# Patient Record
Sex: Female | Born: 1950 | Hispanic: No | Smoking: Never smoker
Health system: Southern US, Community
[De-identification: ages and names within clinical notes are randomized; demographics above are authoritative.]

## PROBLEM LIST (undated history)

## (undated) DIAGNOSIS — Z780 Asymptomatic menopausal state: Secondary | ICD-10-CM

## (undated) DIAGNOSIS — D219 Benign neoplasm of connective and other soft tissue, unspecified: Secondary | ICD-10-CM

## (undated) DIAGNOSIS — G4700 Insomnia, unspecified: Secondary | ICD-10-CM

## (undated) HISTORY — DX: Asymptomatic menopausal state: Z78.0

## (undated) HISTORY — PX: MYOMECTOMY: SHX85

## (undated) HISTORY — DX: Insomnia, unspecified: G47.00

## (undated) HISTORY — DX: Benign neoplasm of connective and other soft tissue, unspecified: D21.9

---

## 2007-05-07 ENCOUNTER — Encounter: Admission: RE | Admit: 2007-05-07 | Discharge: 2007-05-07 | Payer: Self-pay | Admitting: Family Medicine

## 2007-05-14 ENCOUNTER — Encounter: Admission: RE | Admit: 2007-05-14 | Discharge: 2007-05-14 | Payer: Self-pay | Admitting: Family Medicine

## 2007-07-03 ENCOUNTER — Ambulatory Visit: Payer: Self-pay | Admitting: Internal Medicine

## 2007-07-03 DIAGNOSIS — G4700 Insomnia, unspecified: Secondary | ICD-10-CM | POA: Insufficient documentation

## 2007-07-03 DIAGNOSIS — R109 Unspecified abdominal pain: Secondary | ICD-10-CM | POA: Insufficient documentation

## 2007-07-04 LAB — CONVERTED CEMR LAB
ALT: 22 units/L (ref 0–35)
AST: 24 units/L (ref 0–37)
Alkaline Phosphatase: 68 units/L (ref 39–117)
Basophils Absolute: 0.1 10*3/uL (ref 0.0–0.1)
Basophils Relative: 1.1 % — ABNORMAL HIGH (ref 0.0–1.0)
Bilirubin, Direct: 0.1 mg/dL (ref 0.0–0.3)
CO2: 31 meq/L (ref 19–32)
Cholesterol: 208 mg/dL (ref 0–200)
Creatinine, Ser: 0.8 mg/dL (ref 0.4–1.2)
Eosinophils Absolute: 0.1 10*3/uL (ref 0.0–0.7)
Eosinophils Relative: 1 % (ref 0.0–5.0)
GFR calc non Af Amer: 79 mL/min
HDL: 60.8 mg/dL (ref >39.0)
Hemoglobin: 14.1 g/dL (ref 12.0–15.0)
Leukocytes, UA: NEGATIVE
Lymphocytes Relative: 32.4 % (ref 12.0–46.0)
MCHC: 33.2 g/dL (ref 30.0–36.0)
Neutro Abs: 3.1 10*3/uL (ref 1.4–7.7)
Neutrophils Relative %: 55.9 % (ref 43.0–77.0)
Nitrite: NEGATIVE
RBC: 4.81 M/uL (ref 3.87–5.11)
Specific Gravity, Urine: <=1.005 (ref 1.000–1.03)
TSH: 1.58 microintl units/mL (ref 0.35–5.50)
Total CHOL/HDL Ratio: 3.4
Total Protein: 7.8 g/dL (ref 6.0–8.3)
Triglycerides: 44 mg/dL (ref 0–149)
Urine Glucose: NEGATIVE mg/dL
Urobilinogen, UA: 0.2 (ref 0.0–1.0)
WBC: 5.6 10*3/uL (ref 4.5–10.5)

## 2007-07-13 ENCOUNTER — Encounter: Admission: RE | Admit: 2007-07-13 | Discharge: 2007-07-13 | Payer: Self-pay | Admitting: Internal Medicine

## 2007-10-02 ENCOUNTER — Ambulatory Visit: Payer: Self-pay | Admitting: Internal Medicine

## 2007-10-02 DIAGNOSIS — R5383 Other fatigue: Secondary | ICD-10-CM

## 2007-10-02 DIAGNOSIS — R5381 Other malaise: Secondary | ICD-10-CM

## 2007-10-02 DIAGNOSIS — F411 Generalized anxiety disorder: Secondary | ICD-10-CM | POA: Insufficient documentation

## 2008-08-01 ENCOUNTER — Encounter: Payer: Self-pay | Admitting: Internal Medicine

## 2008-09-22 ENCOUNTER — Ambulatory Visit: Payer: Self-pay | Admitting: Internal Medicine

## 2008-09-24 ENCOUNTER — Ambulatory Visit: Payer: Self-pay | Admitting: Internal Medicine

## 2008-09-24 ENCOUNTER — Telehealth: Payer: Self-pay | Admitting: Internal Medicine

## 2008-09-26 ENCOUNTER — Encounter: Payer: Self-pay | Admitting: Internal Medicine

## 2009-01-07 ENCOUNTER — Encounter (INDEPENDENT_AMBULATORY_CARE_PROVIDER_SITE_OTHER): Payer: Self-pay | Admitting: *Deleted

## 2009-03-18 ENCOUNTER — Ambulatory Visit: Payer: Self-pay | Admitting: Internal Medicine

## 2009-03-18 DIAGNOSIS — J020 Streptococcal pharyngitis: Secondary | ICD-10-CM

## 2009-03-18 DIAGNOSIS — J039 Acute tonsillitis, unspecified: Secondary | ICD-10-CM

## 2009-03-18 LAB — CONVERTED CEMR LAB
Basophils Absolute: 0 10*3/uL (ref 0.0–0.1)
Basophils Relative: 0.4 % (ref 0.0–3.0)
Eosinophils Absolute: 0.1 10*3/uL (ref 0.0–0.7)
Eosinophils Relative: 1 % (ref 0.0–5.0)
HCT: 41 % (ref 36.0–46.0)
Lymphocytes Relative: 24.3 % (ref 12.0–46.0)
MCHC: 32.7 g/dL (ref 30.0–36.0)
Mono Screen: NEGATIVE
Monocytes Relative: 10.3 % (ref 3.0–12.0)
RBC: 4.51 M/uL (ref 3.87–5.11)
Rapid Strep: POSITIVE

## 2009-04-15 ENCOUNTER — Ambulatory Visit: Payer: Self-pay | Admitting: Internal Medicine

## 2009-04-15 DIAGNOSIS — R300 Dysuria: Secondary | ICD-10-CM | POA: Insufficient documentation

## 2009-04-16 LAB — CONVERTED CEMR LAB
ALT: 27 units/L (ref 0–35)
Albumin: 4.3 g/dL (ref 3.5–5.2)
BUN: 14 mg/dL (ref 6–23)
Bilirubin Urine: NEGATIVE
CO2: 33 meq/L — ABNORMAL HIGH (ref 19–32)
GFR calc non Af Amer: 54.17 mL/min (ref >60)
Glucose, Bld: 100 mg/dL — ABNORMAL HIGH (ref 70–99)
Ketones, ur: NEGATIVE mg/dL
Potassium: 4.1 meq/L (ref 3.5–5.1)
Sed Rate: 13 mm/hr (ref 0–22)
Specific Gravity, Urine: 1.01 (ref 1.000–1.030)
Urobilinogen, UA: 0.2 (ref 0.0–1.0)
pH: 5.5 (ref 5.0–8.0)

## 2010-02-02 ENCOUNTER — Ambulatory Visit: Payer: Self-pay | Admitting: Internal Medicine

## 2010-02-02 DIAGNOSIS — R1031 Right lower quadrant pain: Secondary | ICD-10-CM

## 2010-02-02 LAB — CONVERTED CEMR LAB
Basophils Relative: 0.5 % (ref 0.0–3.0)
Bilirubin Urine: NEGATIVE
CO2: 32 meq/L (ref 19–32)
Calcium: 9.1 mg/dL (ref 8.4–10.5)
Creatinine, Ser: 0.7 mg/dL (ref 0.4–1.2)
Eosinophils Relative: 0.9 % (ref 0.0–5.0)
HCT: 41.4 % (ref 36.0–46.0)
Hemoglobin, Urine: NEGATIVE
Hemoglobin: 14.1 g/dL (ref 12.0–15.0)
Lymphs Abs: 2.3 10*3/uL (ref 0.7–4.0)
Platelets: 227 10*3/uL (ref 150.0–400.0)
Potassium: 4.2 meq/L (ref 3.5–5.1)
RDW: 13.3 % (ref 11.5–14.6)
Total Bilirubin: 0.6 mg/dL (ref 0.3–1.2)
Total Protein, Urine: NEGATIVE mg/dL
Urine Glucose: NEGATIVE mg/dL
Urobilinogen, UA: 0.2 (ref 0.0–1.0)
pH: 6 (ref 5.0–8.0)

## 2010-02-05 ENCOUNTER — Encounter
Admission: RE | Admit: 2010-02-05 | Discharge: 2010-02-05 | Payer: Self-pay | Source: Home / Self Care | Attending: Internal Medicine | Admitting: Internal Medicine

## 2010-03-30 NOTE — Assessment & Plan Note (Signed)
Summary: fever x 5 days/plot/cd   Vital Signs:  Patient profile:   60 year old female Weight:      159 pounds O2 Sat:      97 % on Room air Temp:     98.3 degrees F oral Pulse rate:   82 / minute Pulse rhythm:   regular Resp:     16 per minute BP sitting:   104 / 68  (left arm) Cuff size:   large  Vitals Entered By: Rock Nephew CMA (March 18, 2009 9:59 AM)  O2 Flow:  Room air CC: congestion, fever, sorethroat, headache, URI symptoms Is Patient Diabetic? No   Primary Care Provider:  Georgina Quint Plotnikov MD  CC:  congestion, fever, sorethroat, headache, and URI symptoms.  History of Present Illness:  URI Symptoms      This is a 60 year old woman who presents with URI symptoms.  The symptoms began 3 weeks ago.  The severity is described as moderate.  The patient reports sore throat and sick contacts, but denies nasal congestion, clear nasal discharge, purulent nasal discharge, dry cough, productive cough, and earache.  Associated symptoms include low-grade fever (<100.5 degrees).  The patient denies stiff neck, dyspnea, wheezing, rash, vomiting, diarrhea, and use of an antipyretic.  The patient denies headache, muscle aches, and severe fatigue.  Risk factors for Strep sinusitis include double sickening, tender adenopathy, and absence of cough.    Current Medications (verified): 1)  Vitamin D3 1000 Unit  Tabs (Cholecalciferol) .Marland Kitchen.. 1 By Mouth Daily 2)  Alprazolam 0.25 Mg Tabs (Alprazolam) .... 1/2 - 1 By Mouth Once Daily - Two Times A Day As Needed Anxiety 3)  Rozerem 8 Mg Tabs (Ramelteon) .Marland Kitchen.. 1 By Mouth At Bedtime For Sleep 4)  Guiatuss Ac 100-10 Mg/21ml Syrp (Guaifenesin-Codeine) .... 5-10 Ml By Mouth Qid As Needed For Cough 5)  Amoxicillin-Pot Clavulanate 875-125 Mg Tabs (Amoxicillin-Pot Clavulanate) .... Take 1 Tablet By Mouth Two Times A Day  Allergies (verified): No Known Drug Allergies  Past History:  Past Medical History: Reviewed history from 07/03/2007 and no  changes required. Insomnia Menopause  Past Surgical History: Reviewed history from 07/03/2007 and no changes required. Myomectomy  Family History: Reviewed history from 07/03/2007 and no changes required. M well Fdied from war wounds  Social History: Reviewed history from 09/22/2008 and no changes required. Occupation: housewife, used to be an Art gallery manager, from  [place] Married Never Smoked Alcohol use-no Drug use-no Regular exercise-yes  Review of Systems       The patient complains of fever and enlarged lymph nodes.  The patient denies prolonged cough, abdominal pain, and suspicious skin lesions.    Physical Exam  General:  alert, well-developed, well-nourished, well-hydrated, normal appearance, healthy-appearing, cooperative to examination, and good hygiene.  mild occasional cough noted during exam. Head:  normocephalic and atraumatic.   Ears:  R ear normal and L ear normal.   Mouth:  good dentition, no pharyngeal crowing, no lesions, no aphthous ulcers, no erosions, no tongue abnormalities, no leukoplakia, no petechiae, pharyngeal erythema, tonsil hypertropied, pharyngeal exudate, posterior lymphoid hypertrophy, and postnasal drip.   Neck:  supple, full ROM, no masses, no thyromegaly, no thyroid nodules or tenderness, no JVD, no carotid bruits, and cervical lymphadenopathy.   Lungs:  normal respiratory effort, no intercostal retractions, no accessory muscle use, normal breath sounds, no dullness, no fremitus, no crackles, and no wheezes.   Heart:  normal rate, regular rhythm, no murmur, no gallop, no rub, and no  JVD.   Abdomen:  soft, non-tender, normal bowel sounds, no distention, no masses, no guarding, no rigidity, no hepatomegaly, and no splenomegaly.   Msk:  normal ROM, no joint tenderness, no joint swelling, no joint warmth, no redness over joints, no joint deformities, no joint instability, and no crepitation.   Pulses:  R and L carotid,radial,femoral,dorsalis pedis and  posterior tibial pulses are full and equal bilaterally Extremities:  No clubbing, cyanosis, edema, or deformity noted with normal full range of motion of all joints.   Neurologic:  No cranial nerve deficits noted. Station and gait are normal. Plantar reflexes are down-going bilaterally. DTRs are symmetrical throughout. Sensory, motor and coordinative functions appear intact. Skin:  turgor normal, color normal, no rashes, no suspicious lesions, and no ecchymoses.   Cervical Nodes:  R anterior LN tender and L anterior LN tender.   Axillary Nodes:  no R axillary adenopathy and no L axillary adenopathy.   Inguinal Nodes:  no R inguinal adenopathy and no L inguinal adenopathy.   Psych:  Cognition and judgment appear intact. Alert and cooperative with normal attention span and concentration. No apparent delusions, illusions, hallucinations   Impression & Recommendations:  Problem # 1:  TONSILLITIS, EXUDATIVE, ACUTE (ICD-463) Assessment New  this looks like mono so will give steroids and check labs  Orders: Venipuncture (62703) TLB-CBC Platelet - w/Differential (85025-CBCD) TLB-Mono Test (Monospot) (50093-GHWE) Admin of Therapeutic Inj  intramuscular or subcutaneous (99371) Depo- Medrol 40mg  (J1030) Depo- Medrol 80mg  (J1040) Rapid Strep (69678)  Problem # 2:  STREP THROAT (ICD-034.0) Assessment: New  if she has indeed been taking augmentin and is still RSS positive then this may be a refractory infection that requires Clindamycin The following medications were removed from the medication list:    Amoxicillin-pot Clavulanate 875-125 Mg Tabs (Amoxicillin-pot clavulanate) .Marland Kitchen... Take 1 tablet by mouth two times a day Her updated medication list for this problem includes:    Clindamycin Hcl 300 Mg Caps (Clindamycin hcl) ..... One by mouth three times a day for 10 days  Orders: Rapid Strep (93810)  Complete Medication List: 1)  Vitamin D3 1000 Unit Tabs (Cholecalciferol) .Marland Kitchen.. 1 by mouth  daily 2)  Alprazolam 0.25 Mg Tabs (Alprazolam) .... 1/2 - 1 by mouth once daily - two times a day as needed anxiety 3)  Rozerem 8 Mg Tabs (Ramelteon) .Marland Kitchen.. 1 by mouth at bedtime for sleep 4)  Clindamycin Hcl 300 Mg Caps (Clindamycin hcl) .... One by mouth three times a day for 10 days  Patient Instructions: 1)  Please schedule a follow-up appointment in 2 weeks. 2)  Take your antibiotic as prescribed until ALL of it is gone, but stop if you develop a rash or swelling and contact our office as soon as possible. Prescriptions: CLINDAMYCIN HCL 300 MG CAPS (CLINDAMYCIN HCL) One by mouth three times a day for 10 days  #30 x 0   Entered and Authorized by:   Etta Grandchild MD   Signed by:   Etta Grandchild MD on 03/18/2009   Method used:   Print then Give to Patient   RxID:   [id number]   Laboratory Results    Other Tests  Rapid Strep: positive    Medication Administration  Injection # 1:    Medication: Depo- Medrol 80mg     Diagnosis: TONSILLITIS, EXUDATIVE, ACUTE (ICD-463)    Route: IM    Site: R deltoid    Exp Date: 11/2009    Lot #: [id number]    Mfr:  [email or web address]    Patient tolerated injection without complications    Given by: Rock Nephew CMA (March 18, 2009 10:24 AM)  Injection # 2:    Medication: Depo- Medrol 40mg     Diagnosis: TONSILLITIS, EXUDATIVE, ACUTE (ICD-463)    Route: IM    Site: R deltoid    Exp Date: 11/2009    Lot #: [id number]    Mfr: teva    Patient tolerated injection without complications    Given by: Rock Nephew CMA (March 18, 2009 10:24 AM)  Orders Added: 1)  Venipuncture [95638] 2)  TLB-CBC Platelet - w/Differential [85025-CBCD] 3)  TLB-Mono Test (Monospot) [86308-MONO] 4)  Admin of Therapeutic Inj  intramuscular or subcutaneous [96372] 5)  Depo- Medrol 40mg  [J1030] 6)  Depo- Medrol 80mg  [J1040] 7)  Est. Patient Level IV [75643] 8)  Rapid Strep [32951]

## 2010-03-30 NOTE — Assessment & Plan Note (Signed)
Summary: ABD / BACK PAIN/CD   Vital Signs:  Patient profile:   60 year old female Height:      67 inches Weight:      160 pounds BMI:     25.15 Temp:     98.7 degrees F oral Pulse rate:   76 / minute Pulse rhythm:   regular Resp:     16 per minute BP sitting:   100 / 68  (left arm) Cuff size:   regular  Vitals Entered By: Lanier Prude, Beverly Gust) 02-14-10 4:34 PM) CC: Rt side abd & LBP Is Patient Diabetic? No   Primary Care Provider:  Georgina Quint Plotnikov MD  CC:  Rt side abd & LBP.  History of Present Illness: C/o pain in RLQ x 2 months off and on worse in am. No sweats or chills. C/o occasional LBP C/o frequent urination 2 wks ago - took [place] two times a day x 5 d - better. All symptoms are vague. It has started after 1 -2 months of intense  jogging  Current Medications (verified): 1)  Vitamin D3 1000 Unit  Tabs (Cholecalciferol) .Marland Kitchen.. 1 By Mouth Daily 2)  Alprazolam 0.25 Mg Tabs (Alprazolam) .... 1/2 - 1 By Mouth Once Daily - Two Times A Day As Needed Anxiety 3)  Rozerem 8 Mg Tabs (Ramelteon) .Marland Kitchen.. 1 By Mouth At Bedtime For Sleep 4)  Vesicare 5 Mg Tabs (Solifenacin Succinate) .Marland Kitchen.. 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  The patient denies fever, weight loss, chest pain, dyspnea on exertion, peripheral edema, melena, hematochezia, hematuria, difficulty walking, unusual weight change, and enlarged lymph nodes.    Physical  Exam  General:  alert, well-developed, well-nourished, well-hydrated, normal appearance, healthy-appearing, cooperative to examination, and good hygiene Eyes:  vision grossly intact, pupils equal, and pupils round.   Mouth:  WNL Neck:  supple, full ROM, no masses, no thyromegaly, no thyroid nodules or tenderness, no JVD, no carotid bruits, and cervical lymphadenopathy.   Lungs:  normal respiratory effort, no intercostal retractions, no accessory muscle use, normal breath sounds, no dullness, no fremitus, no crackles, and no wheezes.   Heart:  normal rate, regular rhythm, no murmur, no gallop, no rub, and no JVD.   Abdomen:  soft, non-tender, normal bowel sounds, no distention, no masses, no guarding, no rigidity, no hepatomegaly, and no splenomegaly.  The abd is sensitive in RLQ No hernia Msk:  LS - WNL R groin and thigh NT w/palp and w/ROM Extremities:  No clubbing, cyanosis, edema, or deformity noted with normal full range of motion of all joints.   Neurologic:  No cranial nerve deficits noted. Station and gait are normal. Plantar reflexes are down-going bilaterally. DTRs are symmetrical throughout. Sensory, motor and coordinative functions appear intact. Skin:  Intact without suspicious lesions or rashes Psych:  Cognition and judgment appear intact. Alert and cooperative with normal attention span and concentration. No apparent delusions, illusions, hallucinations   Impression &  Recommendations:  Problem # 1:  RLQ PAIN (ICD-789.03) - likely a muscle strain; need to r/o pelvic pathology Assessment New See "Patient Instructions". See meds. Orders: Radiology Referral (Radiology) - transvag. [place] TLB-BMP (Basic Metabolic Panel-BMET) (80048-METABOL) TLB-CBC Platelet - w/Differential (85025-CBCD) TLB-Hepatic/Liver Function Pnl (80076-HEPATIC) TLB-Sedimentation Rate (ESR) (85652-ESR) TLB-Udip ONLY (81003-UDIP)  Problem # 2:  LOW BACK PAIN, ACUTE (ICD-724.2) MSK - related to #1 Assessment:  Comment Only  Her updated medication list for this problem includes:    Ibuprofen 600 Mg Tabs (Ibuprofen) .Marland Kitchen... 1 by mouth bid  pc x 3 wks then as needed for  pain  Complete Medication List: 1)  Vitamin D3 1000 Unit Tabs (Cholecalciferol) .Marland Kitchen.. 1 by mouth daily 2)  Alprazolam 0.25 Mg Tabs (Alprazolam) .... 1/2 - 1 by mouth once daily - two times a day as needed anxiety 3)  Rozerem 8 Mg Tabs (Ramelteon) .Marland Kitchen.. 1 by mouth at bedtime for sleep 4)  Vesicare 5 Mg Tabs (Solifenacin succinate) .Marland Kitchen.. 1 by mouth once daily for your bladder 5)  Ibuprofen 600 Mg Tabs (Ibuprofen) .Marland Kitchen.. 1 by mouth bid  pc x 3 wks then as needed for  pain  Patient Instructions: 1)  Use stretching and balance exercises that I have provided (15 min. or longer every day)  2)  Call if you are not better in a reasonable amount of time or if worse.  3)  Please schedule a follow-up appointment in 1 month. Prescriptions: IBUPROFEN 600 MG TABS (IBUPROFEN) 1 by mouth bid  pc x 3 wks then as needed for  pain  #60 x 1   Entered and Authorized by:   Tresa Garter MD   Signed by:   Tresa Garter MD on 02/02/2010   Method used:   Print then Give to Patient   RxID:   [id number]    Orders Added: 1)  Est. Patient Level IV [08676] 2)  TLB-BMP (Basic Metabolic Panel-BMET) [80048-METABOL] 3)  TLB-CBC Platelet - w/Differential [85025-CBCD] 4)  TLB-Hepatic/Liver Function Pnl [80076-HEPATIC] 5)  TLB-Sedimentation Rate (ESR) [85652-ESR] 6)  TLB-Udip ONLY [81003-UDIP] 7)  Radiology Referral [Radiology]

## 2010-03-30 NOTE — Assessment & Plan Note (Signed)
Summary: cold symptoms-lb   Vital Signs:  Patient profile:   60 year old female Height:      67 inches Weight:      160 pounds BMI:     25.15 Temp:     97 degrees F oral Pulse rate:   86 / minute BP sitting:   110 / 62  (left arm)  Vitals Entered By: Tora Perches (April 15, 2009 2:22 PM) CC: cold sx Is Patient Diabetic? No   Primary Care Provider:  Tresa Garter MD  CC:  cold sx.  History of Present Illness: C/o UTI x 2 wks - took Bactrim that she had at home - better, however still having pain after urination. No vag. d/c. No itching. She took 3 courses of antibiotic for a bad URI recentely... C/o fatigue post URI  Preventive Screening-Counseling & Management  Alcohol-Tobacco     Smoking Status: never  Current Medications (verified): 1)  Vitamin D3 1000 Unit  Tabs (Cholecalciferol) .Marland Kitchen.. 1 By Mouth Daily 2)  Alprazolam 0.25 Mg Tabs (Alprazolam) .... 1/2 - 1 By Mouth Once Daily - Two Times A Day As Needed Anxiety 3)  Rozerem 8 Mg Tabs (Ramelteon) .Marland Kitchen.. 1 By Mouth At Bedtime For Sleep 4)  Clindamycin Hcl 300 Mg Caps (Clindamycin Hcl) .... 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    Physical Exam  General:  alert, well-developed, well-nourished, well-hydrated, normal appearance, healthy-appearing, cooperative to examination, and good hygiene Nose:  WNL Mouth:  WNL Lungs:  normal respiratory effort, no intercostal retractions, no accessory muscle use, normal breath sounds, no dullness, no  fremitus, no crackles, and no wheezes.   Heart:  normal rate, regular rhythm, no murmur, no gallop, no rub, and no JVD.   Abdomen:  soft, non-tender, normal bowel sounds, no distention, no masses, no guarding, no rigidity, no hepatomegaly, and no splenomegaly.   Msk:  LS - WNL Skin:  Intact without suspicious lesions or rashes   Impression & Recommendations:  Problem # 1:  DYSURIA (ICD-788.1) Assessment New Poss atrophic vaginitis Probiotics    Cipro 500 Mg Tabs (Ciprofloxacin hcl) .Marland Kitchen... 1 by mouth two times a day or Diflucan if abn UA    Vesicare 5 Mg Tabs (Solifenacin succinate) .Marland Kitchen... 1 by mouth once daily for your bladder  Orders: T-Culture, Urine (98119-14782) TLB-Udip ONLY (81003-UDIP)  Problem # 2:  FATIGUE (ICD-780.79) post URI Assessment: New rest more Orders: TLB-B12, Serum-Total ONLY (95621-H08) TLB-BMP (Basic Metabolic Panel-BMET) (80048-METABOL) TLB-Hepatic/Liver Function Pnl (80076-HEPATIC) TLB-Sedimentation Rate (ESR) (85652-ESR) TLB-TSH (Thyroid Stimulating Hormone) (84443-TSH)  Complete Medication List: 1)  Vitamin D3 1000 Unit Tabs (Cholecalciferol) .Marland Kitchen.. 1 by mouth daily 2)  Alprazolam 0.25 Mg Tabs (Alprazolam) .... 1/2 - 1 by mouth once daily - two times a day as needed anxiety 3)  Rozerem 8 Mg Tabs (Ramelteon) .Marland Kitchen.. 1 by mouth at bedtime for sleep 4)  Cipro 500 Mg Tabs (Ciprofloxacin hcl) .Marland Kitchen.. 1 by mouth bid 5)  Diflucan 100 Mg Tabs (Fluconazole) .... Take two tablets on the first day, than  1 by mouth once daily untill gone  for a fungul infection 6)  Vesicare 5 Mg Tabs (Solifenacin succinate) .Marland Kitchen.. 1 by mouth once daily for your bladder  Patient Instructions: 1)  Call if you are not better in a reasonable amount of time or if worse.  Prescriptions: VESICARE 5 MG TABS (SOLIFENACIN SUCCINATE) 1 by mouth once daily for your bladder  #30 x 12   Entered and Authorized by:   Tresa Garter MD   Signed by:   Tresa Garter MD on 04/15/2009   Method  used:   Print then Give to Patient   RxID:   [id number] DIFLUCAN 100 MG TABS (FLUCONAZOLE) Take two tablets on the first day, than  1 by mouth once daily untill gone for a fungul infection  #11 x 1   Entered and Authorized by:   Tresa Garter MD   Signed by:   Tresa Garter MD on 04/15/2009   Method used:   Print then Give to Patient   RxID:   [id number] CIPRO 500 MG TABS (CIPROFLOXACIN HCL) 1 by mouth bid  #20 x 0   Entered and Authorized by:   Tresa Garter MD   Signed by:   Tresa Garter MD on 04/15/2009   Method used:   Print then Give to Patient   RxID:   [id number]

## 2010-07-16 ENCOUNTER — Other Ambulatory Visit (INDEPENDENT_AMBULATORY_CARE_PROVIDER_SITE_OTHER): Payer: BC Managed Care – PPO

## 2010-07-16 DIAGNOSIS — Z0000 Encounter for general adult medical examination without abnormal findings: Secondary | ICD-10-CM

## 2010-07-16 DIAGNOSIS — Z13220 Encounter for screening for lipoid disorders: Secondary | ICD-10-CM

## 2010-07-16 LAB — CBC WITH DIFFERENTIAL/PLATELET
Basophils Absolute: 0 10*3/uL (ref 0.0–0.1)
Eosinophils Absolute: 0.1 10*3/uL (ref 0.0–0.7)
Eosinophils Relative: 1.2 % (ref 0.0–5.0)
Lymphocytes Relative: 44.7 % (ref 12.0–46.0)
Lymphs Abs: 2.6 10*3/uL (ref 0.7–4.0)
Monocytes Absolute: 0.6 10*3/uL (ref 0.1–1.0)
Monocytes Relative: 10 % (ref 3.0–12.0)
Neutro Abs: 2.5 10*3/uL (ref 1.4–7.7)
RDW: 13.3 % (ref 11.5–14.6)

## 2010-07-16 LAB — LDL CHOLESTEROL, DIRECT: Direct LDL: 158.4 mg/dL

## 2010-07-16 LAB — BASIC METABOLIC PANEL
Creatinine, Ser: 0.8 mg/dL (ref 0.4–1.2)
Potassium: 4.3 mEq/L (ref 3.5–5.1)

## 2010-07-16 LAB — LIPID PANEL
Cholesterol: 208 mg/dL — ABNORMAL HIGH (ref 0–200)
HDL: 55.9 mg/dL (ref >39.00)
Total CHOL/HDL Ratio: 4

## 2010-07-16 LAB — URINALYSIS, ROUTINE W REFLEX MICROSCOPIC
Urine Glucose: NEGATIVE
Urobilinogen, UA: 0.2 (ref 0.0–1.0)

## 2010-07-16 LAB — HEPATIC FUNCTION PANEL
ALT: 18 U/L (ref 0–35)
AST: 18 U/L (ref 0–37)
Albumin: 3.7 g/dL (ref 3.5–5.2)

## 2010-07-16 LAB — TSH: TSH: 2.61 u[IU]/mL (ref 0.35–5.50)

## 2010-07-21 ENCOUNTER — Encounter: Payer: Self-pay | Admitting: Internal Medicine

## 2010-07-22 ENCOUNTER — Ambulatory Visit (INDEPENDENT_AMBULATORY_CARE_PROVIDER_SITE_OTHER): Payer: BC Managed Care – PPO | Admitting: Internal Medicine

## 2010-07-22 ENCOUNTER — Ambulatory Visit (INDEPENDENT_AMBULATORY_CARE_PROVIDER_SITE_OTHER)
Admission: RE | Admit: 2010-07-22 | Discharge: 2010-07-22 | Disposition: A | Discharge status: Home / Self Care | Payer: BC Managed Care – PPO | Source: Ambulatory Visit | Attending: Internal Medicine | Admitting: Internal Medicine

## 2010-07-22 ENCOUNTER — Encounter: Payer: Self-pay | Admitting: Internal Medicine

## 2010-07-22 VITALS — BP 92/60 | HR 76 | Temp 97.9°F | Resp 16 | Ht 67.25 in | Wt 157.0 lb

## 2010-07-22 DIAGNOSIS — Z0000 Encounter for general adult medical examination without abnormal findings: Secondary | ICD-10-CM

## 2010-07-22 DIAGNOSIS — Z1231 Encounter for screening mammogram for malignant neoplasm of breast: Secondary | ICD-10-CM

## 2010-07-22 DIAGNOSIS — M545 Low back pain, unspecified: Secondary | ICD-10-CM | POA: Insufficient documentation

## 2010-07-22 DIAGNOSIS — Z23 Encounter for immunization: Secondary | ICD-10-CM

## 2010-07-22 DIAGNOSIS — R109 Unspecified abdominal pain: Secondary | ICD-10-CM

## 2010-07-22 DIAGNOSIS — M79609 Pain in unspecified limb: Secondary | ICD-10-CM

## 2010-07-22 DIAGNOSIS — M79642 Pain in left hand: Secondary | ICD-10-CM

## 2010-07-22 MED ORDER — IBUPROFEN 600 MG PO TABS: 600.0000 mg | ORAL_TABLET | ORAL | Status: DC | PRN

## 2010-07-22 MED ORDER — TETANUS-DIPHTH-ACELL PERTUSSIS 5-2.5-18.5 LF-MCG/0.5 IM SUSP: 0.5000 mL | Freq: Once | INTRAMUSCULAR | Status: DC

## 2010-07-22 NOTE — Assessment & Plan Note (Signed)
We discussed age appropriate health related issues, including available/recomended screening tests and vaccinations. We discussed a need for adhering to healthy diet and exercise. Labs/EKG were reviewed/ordered. All questions were answered. See orders

## 2010-07-22 NOTE — Assessment & Plan Note (Signed)
X ray SI Ibuprofen

## 2010-07-22 NOTE — Assessment & Plan Note (Signed)
RLQ x 6 mo - sounds MSK [place] was OK. It is better and is related to LBP

## 2010-07-22 NOTE — Progress Notes (Signed)
Subjective:    Patient ID: Jacqueline Lynn, female    DOB: 22-Sep-1950, 60 y.o.   MRN: [id number]  HPI  The patient is here for a wellness exam. The patient has been doing well overall without major physical or psychological issues going on lately.  C/o L flank pain and low back pain on R in am - better w/movement. She stopped running 1 mo ago - much better  C/o L 5th digit pain and swelling x 2 mo after a ski fall   Review of Systems  Constitutional: Negative.  Negative for fever, chills, diaphoresis, activity change, appetite change, fatigue and unexpected weight change.  HENT: Negative for hearing loss, ear pain, nosebleeds, congestion, sore throat, facial swelling, rhinorrhea, sneezing, mouth sores, trouble swallowing, neck pain, neck stiffness, postnasal drip, sinus pressure and tinnitus.   Eyes: Negative for pain, discharge, redness, itching and visual disturbance.  Respiratory: Negative for cough, chest tightness, shortness of breath, wheezing and stridor.   Cardiovascular: Negative for chest pain, palpitations and leg swelling.  Gastrointestinal: Positive for abdominal pain (R). Negative for nausea, diarrhea, constipation, blood in stool, abdominal distention, anal bleeding and rectal pain.  Genitourinary: Negative for dysuria, urgency, frequency, hematuria, flank pain, vaginal bleeding, vaginal discharge, difficulty urinating, genital sores and pelvic pain.  Musculoskeletal: Negative for joint swelling, arthralgias and gait problem. Back pain: R.  Skin: Negative.  Negative for rash.  Neurological: Negative for dizziness, tremors, seizures, syncope, speech difficulty, weakness, numbness and headaches.  Hematological: Negative for adenopathy. Does not bruise/bleed easily.  Psychiatric/Behavioral: Negative for suicidal ideas, behavioral problems, sleep disturbance, dysphoric mood and decreased concentration. The patient is not nervous/anxious.        Objective:   Physical Exam    Constitutional: She appears well-developed and well-nourished. No distress.  HENT:  Head: Normocephalic.  Right Ear: External ear normal.  Left Ear: External ear normal.  Nose: Nose normal.  Mouth/Throat: Oropharynx is clear and moist.  Eyes: Conjunctivae are normal. Pupils are equal, round, and reactive to light. Right eye exhibits no discharge. Left eye exhibits no discharge.  Neck: Normal range of motion. Neck supple. No JVD present. No tracheal deviation present. No thyromegaly present.  Cardiovascular: Normal rate, regular rhythm and normal heart sounds.   Pulmonary/Chest: No stridor. No respiratory distress. She has no wheezes.  Abdominal: Soft. Bowel sounds are normal. She exhibits no distension and no mass. There is no tenderness. There is no rebound and no guarding.  Musculoskeletal: She exhibits no edema and no tenderness.  Lymphadenopathy:    She has no cervical adenopathy.  Neurological: She displays normal reflexes. No cranial nerve deficit. She exhibits normal muscle tone. Coordination normal.  Skin: No rash noted. No erythema.  Psychiatric: She has a normal mood and affect. Her behavior is normal. Judgment and thought content normal.       Procedure Note :    Procedure :   Sonography examination   Indication: L 5th MCP dist head swelling x 2 mo    Equipment used : Terason 3000 unit  With 12L5-V linear probe. The images were stored in [employer].  The patient was placed in a decubitus position.   This study revealed a soft tissue swelling over and between 4th and 5th dist MCP heads. Power doppler was inactive.   Impression: Soft tissue swelling L 5th dist MCP. No fracture     Lab Results  Component Value Date   WBC 5.7 07/16/2010   HGB 13.8 07/16/2010   HCT 40.2  07/16/2010   PLT 186.0 07/16/2010   CHOL 208* 07/16/2010   TRIG 63.0 07/16/2010   HDL 55.90 07/16/2010   LDLDIRECT 158.4 07/16/2010   ALT 18 07/16/2010   AST 18 07/16/2010   NA 141 07/16/2010   K 4.3  07/16/2010   CL 105 07/16/2010   CREATININE 0.8 07/16/2010   BUN 12 07/16/2010   CO2 32 07/16/2010   TSH 2.61 07/16/2010    Assessment & Plan:   ABDOMINAL PAIN RLQ x 6 mo - sounds MSK [place] was OK. It is better and is related to LBP  LBP (low back pain) MSK X ray SI Ibuprofen  Hand pain, left Post-traumatic swelling X ray  Well adult exam We discussed age appropriate health related issues, including available/recomended screening tests and vaccinations. We discussed a need for adhering to healthy diet and exercise. Labs/EKG were reviewed/ordered. All questions were answered. See orders

## 2010-07-22 NOTE — Assessment & Plan Note (Signed)
X ray

## 2010-07-23 ENCOUNTER — Encounter: Payer: Self-pay | Admitting: Internal Medicine

## 2010-08-12 ENCOUNTER — Telehealth: Payer: Self-pay | Admitting: *Deleted

## 2010-08-12 NOTE — Telephone Encounter (Signed)
Pt continues to have back pain and say she was supposed to have back xray at last ov? Only hand xray was ordered. OK for Xray? ALSO she continues to have some abd discomfort, does she need a colonoscopy?

## 2010-08-12 NOTE — Telephone Encounter (Signed)
Someone called for pt req a call back regarding questions about xray.

## 2010-08-16 NOTE — Telephone Encounter (Signed)
Let me see her for a f/up Thx

## 2010-08-17 NOTE — Telephone Encounter (Signed)
Pt's spouse advised and transferred to sch appt.

## 2010-10-25 ENCOUNTER — Ambulatory Visit: Payer: BC Managed Care – PPO | Admitting: Internal Medicine

## 2010-10-25 DIAGNOSIS — Z029 Encounter for administrative examinations, unspecified: Secondary | ICD-10-CM

## 2011-01-05 ENCOUNTER — Telehealth: Payer: Self-pay | Admitting: Internal Medicine

## 2011-01-05 DIAGNOSIS — R21 Rash and other nonspecific skin eruption: Secondary | ICD-10-CM

## 2011-01-05 NOTE — Telephone Encounter (Signed)
The pt called and is wanting a dermatology referral.  Thanks!

## 2011-01-05 NOTE — Telephone Encounter (Signed)
Left detailed mess informing pt referral is entered and our [facility] will contact pt.

## 2011-01-05 NOTE — Telephone Encounter (Signed)
Ok thx.

## 2011-10-11 ENCOUNTER — Telehealth: Payer: Self-pay

## 2011-10-11 DIAGNOSIS — Z0000 Encounter for general adult medical examination without abnormal findings: Secondary | ICD-10-CM

## 2011-10-11 NOTE — Telephone Encounter (Signed)
Message copied by Sandi Mealy on Tue Oct 11, 2011 10:29 AM ------      Message from: Burnett Harry      Created: Wed Oct 05, 2011 10:02 AM      Regarding: CPE 01/10/2012       [facility]

## 2011-10-11 NOTE — Telephone Encounter (Signed)
CPX labs entered in Epic

## 2011-10-26 IMAGING — US US TRANSVAGINAL NON-OB
1 series · 13 of 25 positions shown · non-contrast
Comparison: None.

CLINICAL DATA: Right lower quadrant pain.  Status post hysterectomy
with question of cervical remnant.



[Series 1: us transvaginal non-ob · 0.33mm/px · 13 of 40 slices shown]
[im 1/40]
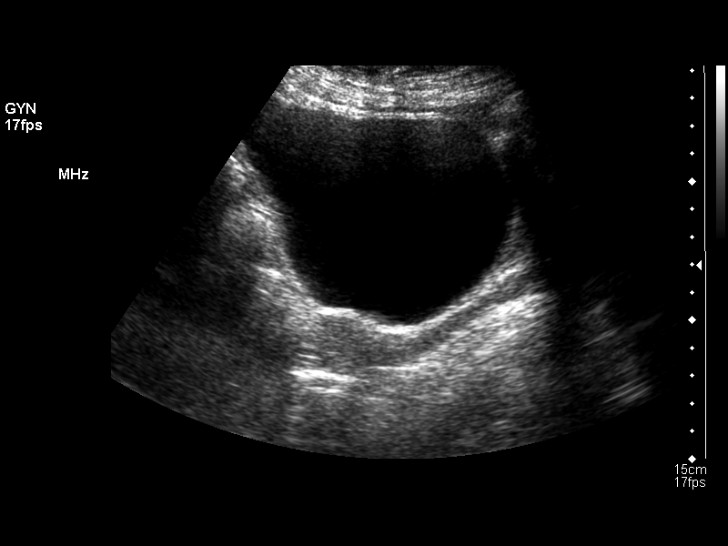
[im 4/40]
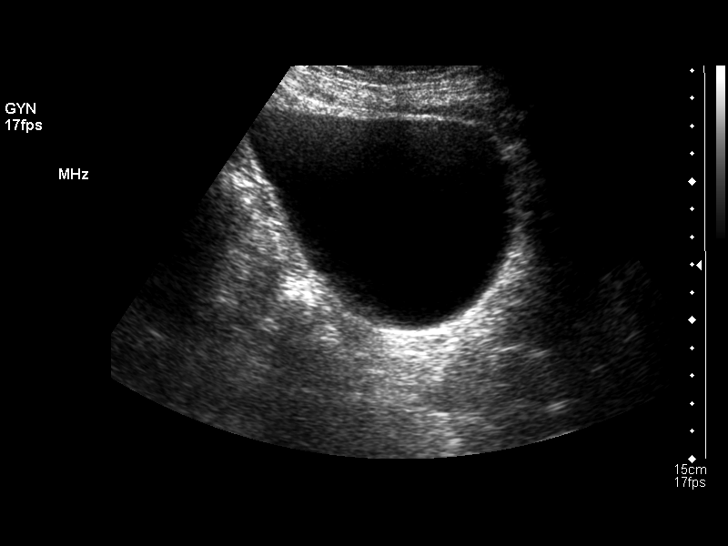
[im 7/40]
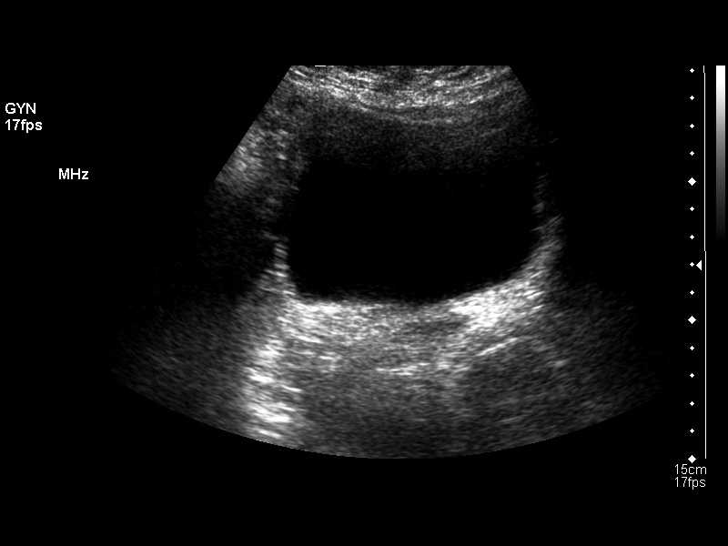
[im 10/40]
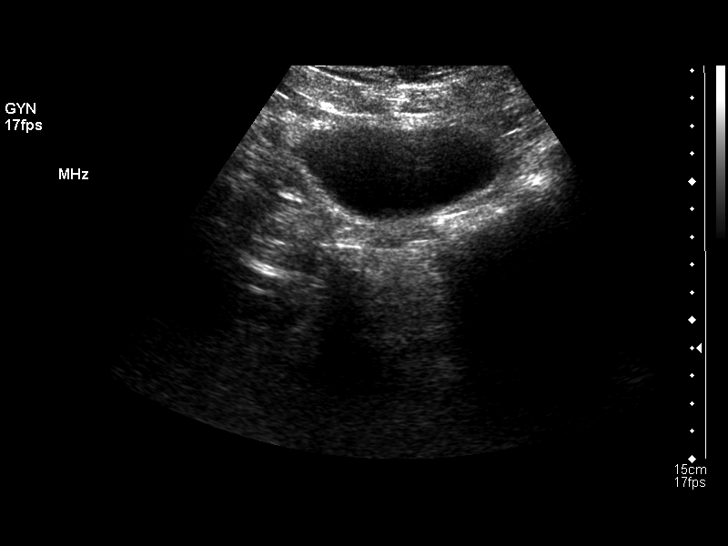
[im 14/40]
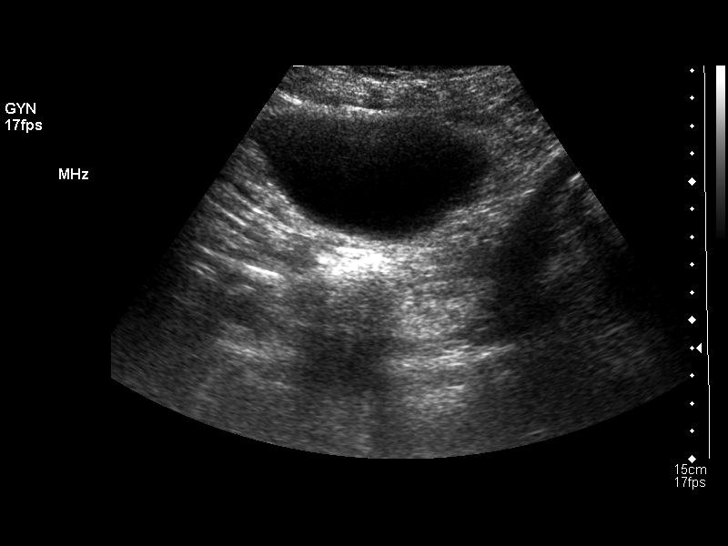
[im 17/40]
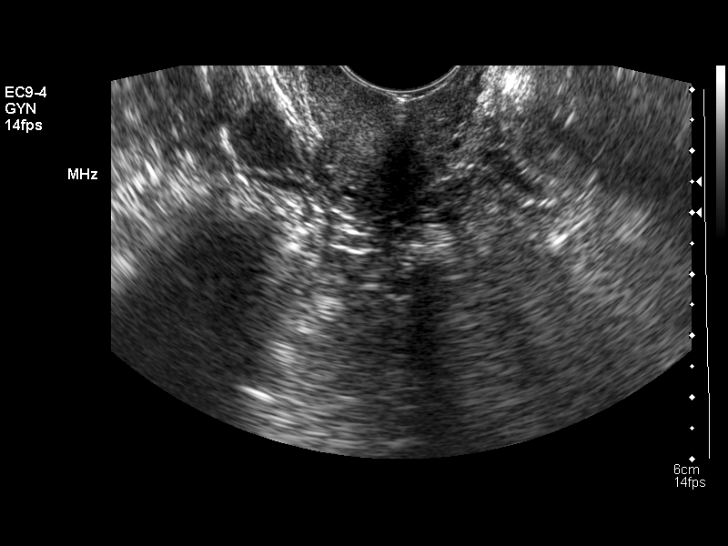
[im 20/40]
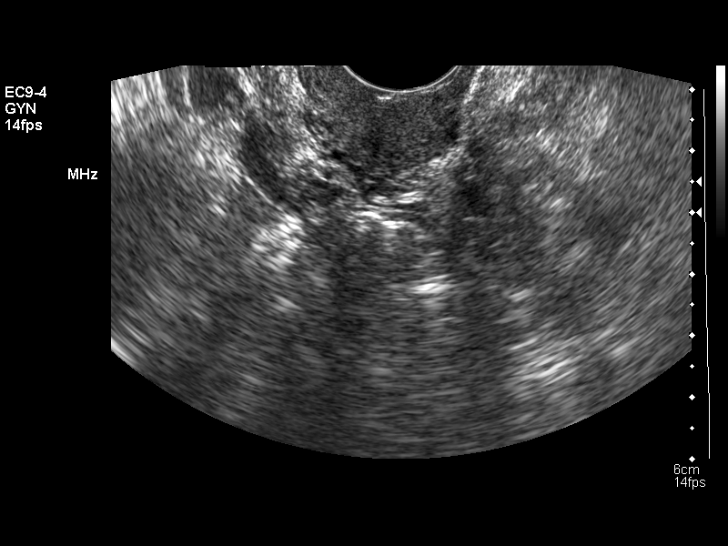
[im 23/40]
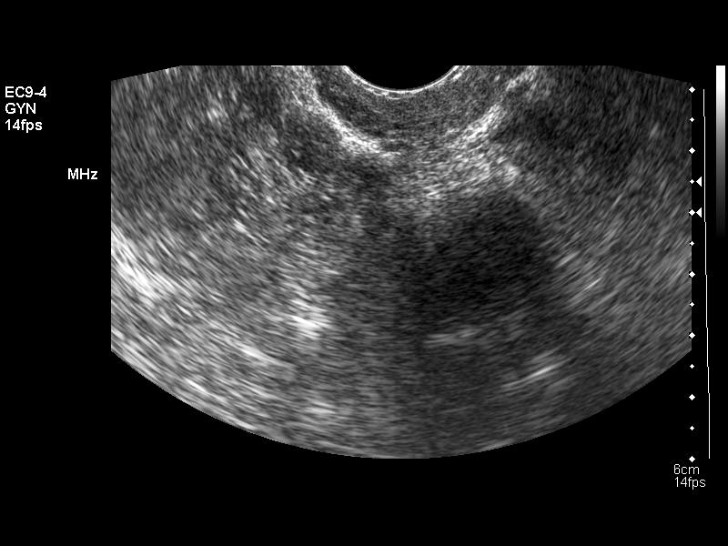
[im 27/40]
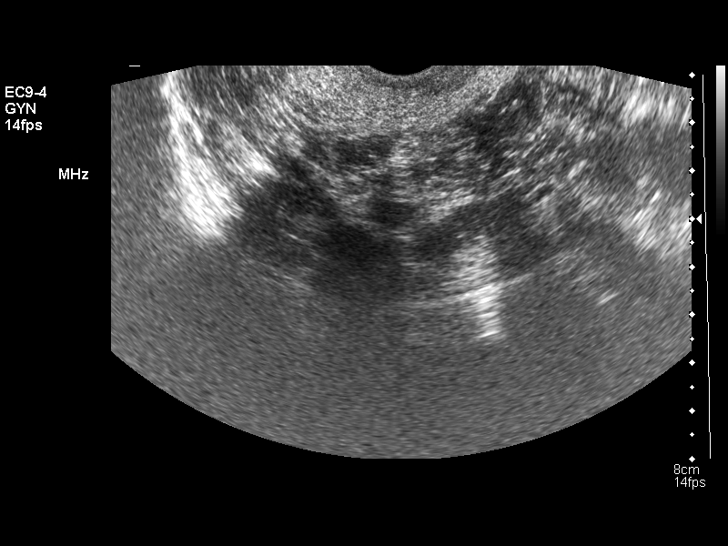
[im 30/40]
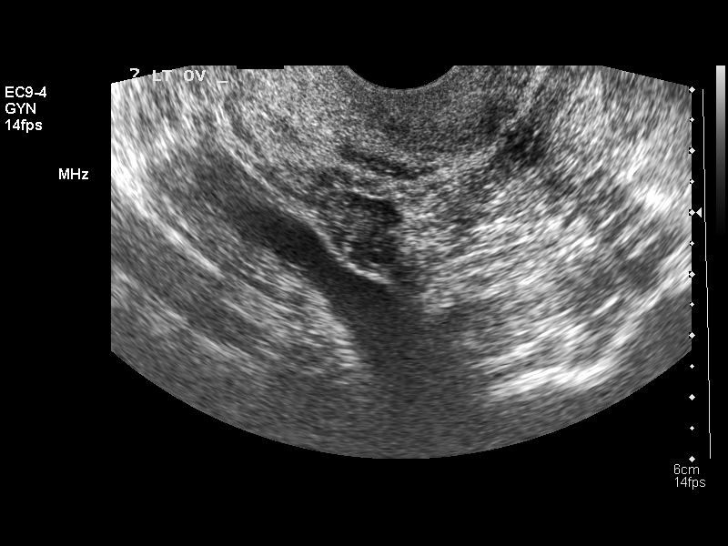
[im 33/40]
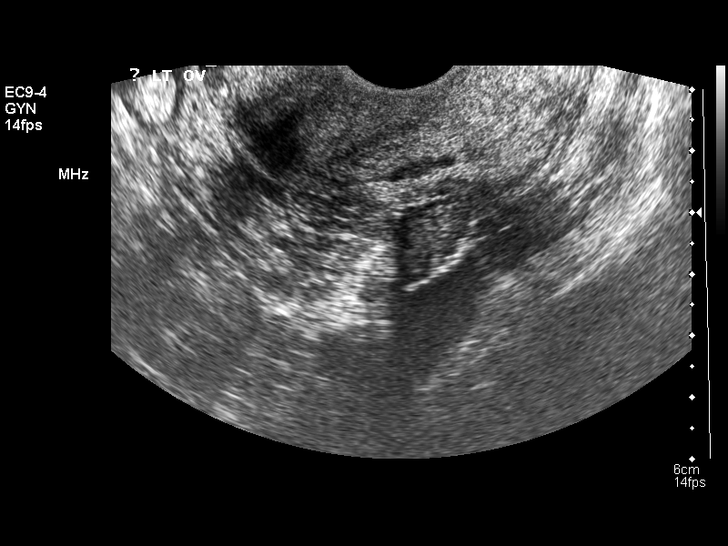
[im 36/40]
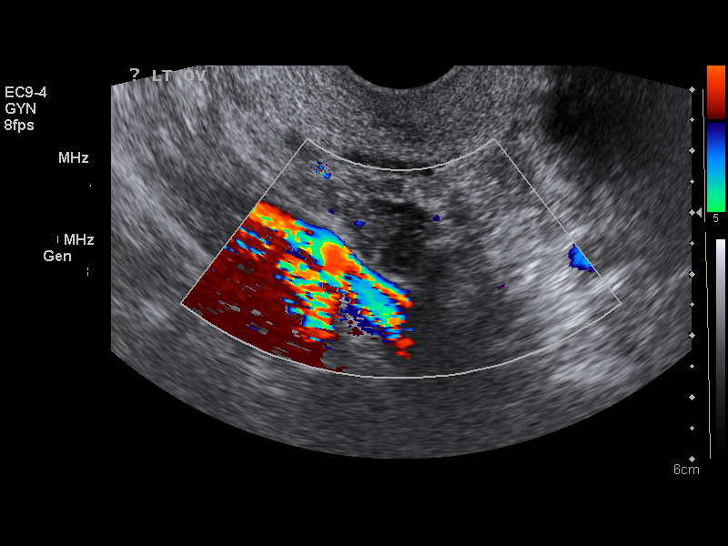
[im 40/40]
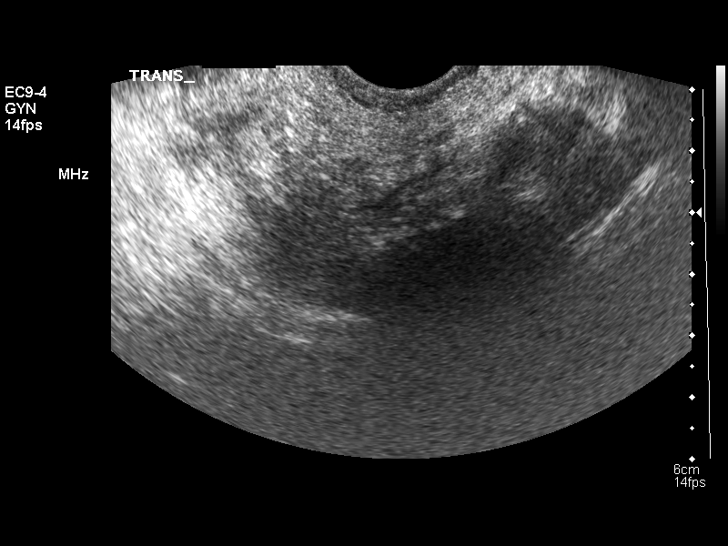

[13 of 25 positions shown; findings below may reference images not displayed]

FINDINGS: Uterus has been surgically removed.  The post hysterectomy  cuff
has unremarkable appearance and is felt likely to be a
supracervical in origin given the presence of a small focal cyst
suspicious for a Nabothian cyst  and the transabdominal morphology.
The cuff is normal in size with no worrisome features apparent

Endometrium not applicable

Right Ovary not seen with confidence either transabdominally or
endovaginally

Left Ovary has a normal appearance measuring 1.7 x 1.1 x 1.1 cm

Other Findings:  No pelvic fluid or separate adnexal masses are
seen.
IMPRESSION: Normal posthysterectomy appearance.  Non-visualized right ovary and
normal left ovary

## 2012-01-10 ENCOUNTER — Encounter: Payer: BC Managed Care – PPO | Admitting: Internal Medicine

## 2012-01-24 ENCOUNTER — Encounter: Payer: Self-pay | Admitting: Internal Medicine

## 2012-01-24 ENCOUNTER — Other Ambulatory Visit (INDEPENDENT_AMBULATORY_CARE_PROVIDER_SITE_OTHER): Payer: BC Managed Care – PPO

## 2012-01-24 ENCOUNTER — Ambulatory Visit (INDEPENDENT_AMBULATORY_CARE_PROVIDER_SITE_OTHER): Payer: BC Managed Care – PPO | Admitting: Internal Medicine

## 2012-01-24 VITALS — BP 130/60 | HR 78 | Temp 97.8°F | Resp 16 | Wt 161.0 lb

## 2012-01-24 DIAGNOSIS — M545 Low back pain, unspecified: Secondary | ICD-10-CM

## 2012-01-24 DIAGNOSIS — H547 Unspecified visual loss: Secondary | ICD-10-CM

## 2012-01-24 DIAGNOSIS — Z0000 Encounter for general adult medical examination without abnormal findings: Secondary | ICD-10-CM

## 2012-01-24 DIAGNOSIS — R011 Cardiac murmur, unspecified: Secondary | ICD-10-CM

## 2012-01-24 DIAGNOSIS — Z1211 Encounter for screening for malignant neoplasm of colon: Secondary | ICD-10-CM

## 2012-01-24 LAB — LIPID PANEL
Cholesterol: 172 mg/dL (ref 0–200)
HDL: 51 mg/dL (ref >39.00)
Triglycerides: 93 mg/dL (ref 0.0–149.0)
VLDL: 18.6 mg/dL (ref 0.0–40.0)

## 2012-01-24 LAB — CBC WITH DIFFERENTIAL/PLATELET
Basophils Relative: 0.5 % (ref 0.0–3.0)
Eosinophils Relative: 1.5 % (ref 0.0–5.0)
HCT: 41.3 % (ref 36.0–46.0)
Hemoglobin: 13.8 g/dL (ref 12.0–15.0)
Lymphs Abs: 2.2 10*3/uL (ref 0.7–4.0)
MCV: 87.4 fl (ref 78.0–100.0)
Monocytes Absolute: 0.6 10*3/uL (ref 0.1–1.0)
Monocytes Relative: 9.9 % (ref 3.0–12.0)
Neutro Abs: 2.9 10*3/uL (ref 1.4–7.7)
WBC: 5.9 10*3/uL (ref 4.5–10.5)

## 2012-01-24 LAB — COMPREHENSIVE METABOLIC PANEL
Alkaline Phosphatase: 73 U/L (ref 39–117)
BUN: 12 mg/dL (ref 6–23)
CO2: 33 mEq/L — ABNORMAL HIGH (ref 19–32)
Creatinine, Ser: 0.7 mg/dL (ref 0.4–1.2)
GFR: 84.78 mL/min (ref >60.00)
Glucose, Bld: 99 mg/dL (ref 70–99)
Total Bilirubin: 0.8 mg/dL (ref 0.3–1.2)
Total Protein: 7.2 g/dL (ref 6.0–8.3)

## 2012-01-24 LAB — URINALYSIS, ROUTINE W REFLEX MICROSCOPIC
Bilirubin Urine: NEGATIVE
Nitrite: NEGATIVE
Total Protein, Urine: NEGATIVE
Urine Glucose: NEGATIVE
pH: 7.5 (ref 5.0–8.0)

## 2012-01-24 NOTE — Assessment & Plan Note (Signed)
Cardiac ECHO 

## 2012-01-24 NOTE — Assessment & Plan Note (Signed)
We discussed age appropriate health related issues, including available/recomended screening tests and vaccinations. We discussed a need for adhering to healthy diet and exercise. Labs/EKG were reviewed/ordered. All questions were answered.   

## 2012-01-24 NOTE — Progress Notes (Signed)
Patient ID: Jacqueline Lynn, female   DOB: [birth year], 61 y.o.   MRN: [id number]  Subjective:    Patient ID: Jacqueline Lynn, female    DOB: 16-Oct-1950, 61 y.o.   MRN: [id number]  HPI  The patient is here for a wellness exam. The patient has been doing well overall without major physical or psychological issues going on lately.  C/o DOE, fatigue off and on x several months.  Wt Readings from Last 3 Encounters:  01/24/12 161 lb (73.029 kg)  07/22/10 157 lb (71.215 kg)  02/02/10 160 lb (72.576 kg)   BP Readings from Last 3 Encounters:  01/24/12 130/60  07/22/10 92/60  02/02/10 100/68      Review of Systems  Constitutional: Negative.  Negative for fever, chills, diaphoresis, activity change, appetite change, fatigue and unexpected weight change.  HENT: Negative for hearing loss, ear pain, nosebleeds, congestion, sore throat, facial swelling, rhinorrhea, sneezing, mouth sores, trouble swallowing, neck pain, neck stiffness, postnasal drip, sinus pressure and tinnitus.   Eyes: Negative for pain, discharge, redness, itching and visual disturbance.  Respiratory: Negative for cough, chest tightness, shortness of breath, wheezing and stridor.   Cardiovascular: Negative for chest pain, palpitations and leg swelling.  Gastrointestinal: Negative for nausea, abdominal pain, diarrhea, constipation, blood in stool, abdominal distention, anal bleeding and rectal pain.  Genitourinary: Negative for dysuria, urgency, frequency, hematuria, flank pain, vaginal bleeding, vaginal discharge, difficulty urinating, genital sores and pelvic pain.  Musculoskeletal: Negative for back pain, joint swelling, arthralgias and gait problem.  Skin: Negative.  Negative for rash.  Neurological: Negative for dizziness, tremors, seizures, syncope, speech difficulty, weakness, numbness and headaches.  Hematological: Negative for adenopathy. Does not bruise/bleed easily.  Psychiatric/Behavioral: Negative for suicidal  ideas, behavioral problems, sleep disturbance, dysphoric mood and decreased concentration. The patient is not nervous/anxious.        Objective:   Physical Exam  Constitutional: She appears well-developed and well-nourished. No distress.  HENT:  Head: Normocephalic.  Right Ear: External ear normal.  Left Ear: External ear normal.  Nose: Nose normal.  Mouth/Throat: Oropharynx is clear and moist.  Eyes: Conjunctivae normal are normal. Pupils are equal, round, and reactive to light. Right eye exhibits no discharge. Left eye exhibits no discharge.  Neck: Normal range of motion. Neck supple. No JVD present. No tracheal deviation present. No thyromegaly present.  Cardiovascular: Normal rate and regular rhythm.   Murmur (1-2/6) heard. Pulmonary/Chest: No stridor. No respiratory distress. She has no wheezes.  Abdominal: Soft. Bowel sounds are normal. She exhibits no distension and no mass. There is no tenderness. There is no rebound and no guarding.  Musculoskeletal: She exhibits no edema and no tenderness.  Lymphadenopathy:    She has no cervical adenopathy.  Neurological: She displays normal reflexes. No cranial nerve deficit. She exhibits normal muscle tone. Coordination normal.  Skin: No rash noted. No erythema.  Psychiatric: She has a normal mood and affect. Her behavior is normal. Judgment and thought content normal.        Lab Results  Component Value Date   WBC 5.9 01/24/2012   HGB 13.8 01/24/2012   HCT 41.3 01/24/2012   PLT 216.0 01/24/2012   CHOL 172 01/24/2012   TRIG 93.0 01/24/2012   HDL 51.00 01/24/2012   LDLDIRECT 158.4 07/16/2010   ALT 20 01/24/2012   AST 16 01/24/2012   NA 139 01/24/2012   K 4.4 01/24/2012   CL 101 01/24/2012   CREATININE 0.7 01/24/2012   BUN 12 01/24/2012  CO2 33* 01/24/2012   TSH 2.35 01/24/2012    Assessment & Plan:

## 2012-01-24 NOTE — Assessment & Plan Note (Signed)
RESOLVED. 

## 2012-02-01 ENCOUNTER — Telehealth: Payer: Self-pay | Admitting: Internal Medicine

## 2012-02-01 NOTE — Telephone Encounter (Signed)
Husband was concerned about her EKG tel [phone]. We have already scheduled ECHO - can we do ECHO this week - pls reschedule? Thx AP

## 2012-02-03 ENCOUNTER — Ambulatory Visit: Payer: BC Managed Care – PPO | Admitting: Internal Medicine

## 2012-02-03 ENCOUNTER — Ambulatory Visit (INDEPENDENT_AMBULATORY_CARE_PROVIDER_SITE_OTHER): Payer: BC Managed Care – PPO | Admitting: Internal Medicine

## 2012-02-03 ENCOUNTER — Encounter: Payer: Self-pay | Admitting: Internal Medicine

## 2012-02-03 ENCOUNTER — Ambulatory Visit (INDEPENDENT_AMBULATORY_CARE_PROVIDER_SITE_OTHER)
Admission: RE | Admit: 2012-02-03 | Discharge: 2012-02-03 | Disposition: A | Discharge status: Home / Self Care | Payer: BC Managed Care – PPO | Source: Ambulatory Visit | Attending: Internal Medicine | Admitting: Internal Medicine

## 2012-02-03 ENCOUNTER — Ambulatory Visit (HOSPITAL_COMMUNITY): Payer: BC Managed Care – PPO | Attending: Cardiology | Admitting: Radiology

## 2012-02-03 VITALS — BP 112/72 | HR 80 | Temp 97.7°F | Resp 16 | Wt 161.0 lb

## 2012-02-03 DIAGNOSIS — I491 Atrial premature depolarization: Secondary | ICD-10-CM | POA: Insufficient documentation

## 2012-02-03 DIAGNOSIS — I519 Heart disease, unspecified: Secondary | ICD-10-CM

## 2012-02-03 DIAGNOSIS — R5383 Other fatigue: Secondary | ICD-10-CM

## 2012-02-03 DIAGNOSIS — R0602 Shortness of breath: Secondary | ICD-10-CM

## 2012-02-03 DIAGNOSIS — R5381 Other malaise: Secondary | ICD-10-CM | POA: Insufficient documentation

## 2012-02-03 DIAGNOSIS — R0609 Other forms of dyspnea: Secondary | ICD-10-CM

## 2012-02-03 DIAGNOSIS — R0989 Other specified symptoms and signs involving the circulatory and respiratory systems: Secondary | ICD-10-CM

## 2012-02-03 DIAGNOSIS — R011 Cardiac murmur, unspecified: Secondary | ICD-10-CM | POA: Insufficient documentation

## 2012-02-03 DIAGNOSIS — F411 Generalized anxiety disorder: Secondary | ICD-10-CM | POA: Insufficient documentation

## 2012-02-03 DIAGNOSIS — R0600 Dyspnea, unspecified: Secondary | ICD-10-CM | POA: Insufficient documentation

## 2012-02-03 MED ORDER — FLUTICASONE-SALMETEROL 100-50 MCG/DOSE IN AEPB: 1.0000 | INHALATION_SPRAY | Freq: Two times a day (BID) | RESPIRATORY_TRACT | Status: DC

## 2012-02-03 NOTE — Progress Notes (Signed)
Echocardiogram performed.  

## 2012-02-03 NOTE — Assessment & Plan Note (Signed)
We can consider Diltiazem

## 2012-02-03 NOTE — Assessment & Plan Note (Addendum)
We will try Advair disk inhaler qd. She is medically clear for her neck lift surgery CXR  No CP. C/o cough and SOB - seasonal, once a year. She never smoked. Her sister has arrhythmias. Jacqueline Lynn's family is worried.

## 2012-02-03 NOTE — Assessment & Plan Note (Signed)
Labs/ECHO/CXR/EKG

## 2012-02-03 NOTE — Assessment & Plan Note (Addendum)
We can consider Diltiazem if her BP would allow No CP. C/o cough and SOB - seasonal, once a year. She never smoked. Her sister has arrhythmias. Medina's family is worried, we will ask for a cardiology consult w/Dr Tenny Craw

## 2012-02-03 NOTE — Progress Notes (Signed)
Subjective:    Patient ID: Jacqueline Lynn, female    DOB: 1950-12-10, 61 y.o.   MRN: [id number]  HPI    C/o DOE, fatigue off and on x several months. No CP. C/o cough and SOB - seasonal, once a year. She never smoked. Her sister has arrhythmias. Sache's family is worried.  Wt Readings from Last 3 Encounters:  02/03/12 161 lb (73.029 kg)  01/24/12 161 lb (73.029 kg)  07/22/10 157 lb (71.215 kg)   BP Readings from Last 3 Encounters:  02/03/12 112/72  01/24/12 130/60  07/22/10 92/60      Review of Systems  Constitutional: Negative.  Negative for fever, chills, diaphoresis, activity change, appetite change, fatigue and unexpected weight change.  HENT: Negative for hearing loss, ear pain, nosebleeds, congestion, sore throat, facial swelling, rhinorrhea, sneezing, mouth sores, trouble swallowing, neck pain, neck stiffness, postnasal drip, sinus pressure and tinnitus.   Eyes: Negative for pain, discharge, redness, itching and visual disturbance.  Respiratory: Negative for cough, chest tightness, shortness of breath, wheezing and stridor.   Cardiovascular: Negative for chest pain, palpitations and leg swelling.  Gastrointestinal: Negative for nausea, abdominal pain, diarrhea, constipation, blood in stool, abdominal distention, anal bleeding and rectal pain.  Genitourinary: Negative for dysuria, urgency, frequency, hematuria, flank pain, vaginal bleeding, vaginal discharge, difficulty urinating, genital sores and pelvic pain.  Musculoskeletal: Negative for back pain, joint swelling, arthralgias and gait problem.  Skin: Negative.  Negative for rash.  Neurological: Negative for dizziness, tremors, seizures, syncope, speech difficulty, weakness, numbness and headaches.  Hematological: Negative for adenopathy. Does not bruise/bleed easily.  Psychiatric/Behavioral: Negative for suicidal ideas, behavioral problems, sleep disturbance, dysphoric mood and decreased concentration. The patient  is not nervous/anxious.        Objective:   Physical Exam  Constitutional: She appears well-developed and well-nourished. No distress.  HENT:  Head: Normocephalic.  Right Ear: External ear normal.  Left Ear: External ear normal.  Nose: Nose normal.  Mouth/Throat: Oropharynx is clear and moist.  Eyes: Conjunctivae normal are normal. Pupils are equal, round, and reactive to light. Right eye exhibits no discharge. Left eye exhibits no discharge.  Neck: Normal range of motion. Neck supple. No JVD present. No tracheal deviation present. No thyromegaly present.  Cardiovascular: Normal rate and regular rhythm.   Murmur (1-2/6) heard. Pulmonary/Chest: No stridor. No respiratory distress. She has no wheezes.  Abdominal: Soft. Bowel sounds are normal. She exhibits no distension and no mass. There is no tenderness. There is no rebound and no guarding.  Musculoskeletal: She exhibits no edema and no tenderness.  Lymphadenopathy:    She has no cervical adenopathy.  Neurological: She displays normal reflexes. No cranial nerve deficit. She exhibits normal muscle tone. Coordination normal.  Skin: No rash noted. No erythema.  Psychiatric: She has a normal mood and affect. Her behavior is normal. Judgment and thought content normal.        Lab Results  Component Value Date   WBC 5.9 01/24/2012   HGB 13.8 01/24/2012   HCT 41.3 01/24/2012   PLT 216.0 01/24/2012   CHOL 172 01/24/2012   TRIG 93.0 01/24/2012   HDL 51.00 01/24/2012   LDLDIRECT 158.4 07/16/2010   ALT 20 01/24/2012   AST 16 01/24/2012   NA 139 01/24/2012   K 4.4 01/24/2012   CL 101 01/24/2012   CREATININE 0.7 01/24/2012   BUN 12 01/24/2012   CO2 33* 01/24/2012   TSH 2.35 01/24/2012   ECHO 02/03/12: Study Conclusions  -  Left ventricle: The cavity size was normal. Wall thickness was normal. Systolic function was normal. The estimated ejection fraction was in the range of 60% to 65%. Wall motion was normal; there were no  regional wall motion abnormalities. Doppler parameters are consistent with abnormal left ventricular relaxation (grade 1 diastolic dysfunction). - Aortic valve: There was no stenosis. - Mitral valve: Mildly calcified annulus. Trivial regurgitation. - Right ventricle: The cavity size was normal. Systolic function was normal. - Tricuspid valve: Peak RV-RA gradient: 15mm Hg (S). - Pulmonary arteries: PA peak pressure: 20mm Hg (S). - Inferior vena cava: The vessel was normal in size; the respirophasic diameter changes were in the normal range (= 50%); findings are consistent with normal central venous pressure. Impressions:  - Normal LV size and systolic function, EF 60-65%. Normal RV size and systolic function. No significant valvular abnormalities. Transthoracic echocardiography. M-mode, complete 2D, spectral Doppler, and color Doppler. Height: Height: 170cm. Height: 66.9in. Weight: Weight: 73kg. Weight: 160.7lb. Body mass index: BMI: 25.3kg/m^2. Body surface area: BSA: 1.37m^2. Blood pressure: 130/60. Patient status: Outpatient. Location:  Site 3  ------------------------------------------------------------  ------------------------------------------------------------ Left ventricle: The cavity size was normal. Wall thickness was normal. Systolic function was normal. The estimated ejection fraction was in the range of 60% to 65%. Wall motion was normal; there were no regional wall motion abnormalities. Doppler parameters are consistent with abnormal left ventricular relaxation (grade 1 diastolic dysfunction).  ------------------------------------------------------------ Aortic valve: Mildly calcified leaflets. Doppler: There was no stenosis. No regurgitation.  ------------------------------------------------------------ Aorta: Aortic root: The aortic root was normal in size. Ascending aorta: The ascending aorta was normal in  size.  ------------------------------------------------------------ Mitral valve: Mildly calcified annulus. Doppler: There was no evidence for stenosis. Trivial regurgitation. Peak gradient: 2mm Hg (D).  ------------------------------------------------------------ Left atrium: The atrium was normal in size.  ------------------------------------------------------------ Right ventricle: The cavity size was normal. Systolic function was normal.  ------------------------------------------------------------ Pulmonic valve: Structurally normal valve. Cusp separation was normal. Doppler: Transvalvular velocity was within the normal range. No regurgitation.  ------------------------------------------------------------ Tricuspid valve: Doppler: Trivial regurgitation.  ------------------------------------------------------------ Right atrium: The atrium was normal in size.  ------------------------------------------------------------ Pericardium: There was no pericardial effusion.  ------------------------------------------------------------ Systemic veins: Inferior vena cava: The vessel was normal in size; the respirophasic diameter changes were in the normal range (= 50%); findings are consistent with normal central venous pressure.    I personally provided the Advair use teaching. After the teaching patient was able to demonstrate it's use effectively. All questions were answered    Assessment & Plan:

## 2012-02-17 ENCOUNTER — Ambulatory Visit: Payer: BC Managed Care – PPO | Admitting: Internal Medicine

## 2012-03-12 ENCOUNTER — Encounter: Payer: Self-pay | Admitting: Internal Medicine

## 2012-03-12 ENCOUNTER — Ambulatory Visit (INDEPENDENT_AMBULATORY_CARE_PROVIDER_SITE_OTHER): Payer: BC Managed Care – PPO | Admitting: Internal Medicine

## 2012-03-12 VITALS — BP 107/68 | HR 67 | Ht 66.93 in | Wt 164.0 lb

## 2012-03-12 DIAGNOSIS — R079 Chest pain, unspecified: Secondary | ICD-10-CM

## 2012-03-12 NOTE — Progress Notes (Signed)
HPI Patient feels fatigued more and more over past year.  Cant't do anything  Gives out  No CP  Some SOB when happening. Denies palpitations  No CP  No PND  No Edema Labs in fall without abnormality  LDL was 102 Family history without CAD  Sister has ?NSVT  Echo in December showed normal LV systolic function.  Mild diastolic dysfunction.  No signif valve abnormality.  No Known Allergies  Current Outpatient Prescriptions  Medication Sig Dispense Refill  . Cholecalciferol (EQL VITAMIN D3) 1000 UNITS tablet Take 1,000 Units by mouth daily.        . Fluticasone-Salmeterol (ADVAIR DISKUS) 100-50 MCG/DOSE AEPB Inhale 1 puff into the lungs 2 (two) times daily.  1 each  5  . ibuprofen (ADVIL,MOTRIN) 600 MG tablet Take 1 tablet (600 mg total) by mouth as needed.  60 tablet  2   Current Facility-Administered Medications  Medication Dose Route Frequency Provider Last Rate Last Dose  . TDaP (BOOSTRIX) injection 0.5 mL  0.5 mL Intramuscular Once Tresa Garter, MD        Past Medical History  Diagnosis Date  . Insomnia   . Menopause 62 yrs old    Past Surgical History  Procedure Date  . Myomectomy 62 yrs old    No family history on file.  History   Social History  . Marital Status: Married    Spouse Name: N/A    Number of Children: N/A  . Years of Education: N/A   Occupational History  . Not on file.   Social History Main Topics  . Smoking status: Never Smoker   . Smokeless tobacco: Not on file  . Alcohol Use: No  . Drug Use: No  . Sexually Active: Yes   Other Topics Concern  . Not on file   Social History Narrative  . No narrative on file    Review of Systems:  All systems reviewed.  They are negative to the above problem except as previously stated.  Vital Signs: BP 107/68  Pulse 67  Ht 5' 6.93" (1.7 m)  Wt 164 lb (74.39 kg)  BMI 25.74 kg/m2  Physical Exam Patient is in NAD HEENT:  Normocephalic, atraumatic. EOMI, PERRLA.  Neck: JVP is normal.  No  bruits.  Lungs: clear to auscultation. No rales no wheezes.  Heart: Regular rate and rhythm. Normal S1, S2. No S3.   No significant murmurs. PMI not displaced.  Abdomen:  Supple, nontender. Normal bowel sounds. No masses. No hepatomegaly.  Extremities:   Good distal pulses throughout. No lower extremity edema.  Musculoskeletal :moving all extremities.  Neuro:   alert and oriented x3.  CN II-XII grossly intact.  EKG  SR 68 bpm.   Assessment and Plan:  1. Fatigue/DOE  Patient's husband notes a change over 1 year of her exercise tolerance  Labs, echo were only significatn for Gr I diastolic dysfunction.   She has used inhalers in the past but is not wheezing now and is moving air well.   I would recomm a cardiopulmonary stress test to evaluate abilities/restrictions.  Continue acitvities as tolerated for now.

## 2012-03-12 NOTE — Patient Instructions (Signed)
Your physician has recommended that you have a cardiopulmonary stress test (CPX). CPX testing is a non-invasive measurement of heart and lung function. It replaces a traditional treadmill stress test. This type of test provides a tremendous amount of information that relates not only to your present condition but also for future outcomes. This test combines measurements of you ventilation, respiratory gas exchange in the lungs, electrocardiogram (EKG), blood pressure and physical response before, during, and following an exercise protocol.  We will call you with results after the test is completed.

## 2012-03-26 ENCOUNTER — Ambulatory Visit (HOSPITAL_COMMUNITY): Payer: BC Managed Care – PPO | Attending: Internal Medicine

## 2012-03-26 DIAGNOSIS — R5383 Other fatigue: Secondary | ICD-10-CM | POA: Insufficient documentation

## 2012-03-26 DIAGNOSIS — R0989 Other specified symptoms and signs involving the circulatory and respiratory systems: Secondary | ICD-10-CM | POA: Insufficient documentation

## 2012-03-26 DIAGNOSIS — R079 Chest pain, unspecified: Secondary | ICD-10-CM

## 2012-03-26 DIAGNOSIS — R0609 Other forms of dyspnea: Secondary | ICD-10-CM | POA: Insufficient documentation

## 2012-03-26 DIAGNOSIS — R5381 Other malaise: Secondary | ICD-10-CM | POA: Insufficient documentation

## 2012-04-03 ENCOUNTER — Encounter: Payer: Self-pay | Admitting: Gastroenterology

## 2012-04-10 IMAGING — CR DG HAND 2V*L*
2 series · 2 of 2 positions shown · non-contrast
Comparison: None.

CLINICAL DATA: Swelling and pain involving left fifth metacarpal
region.

LEFT HAND - 2 VIEW

[view not recorded (1 of 2)]
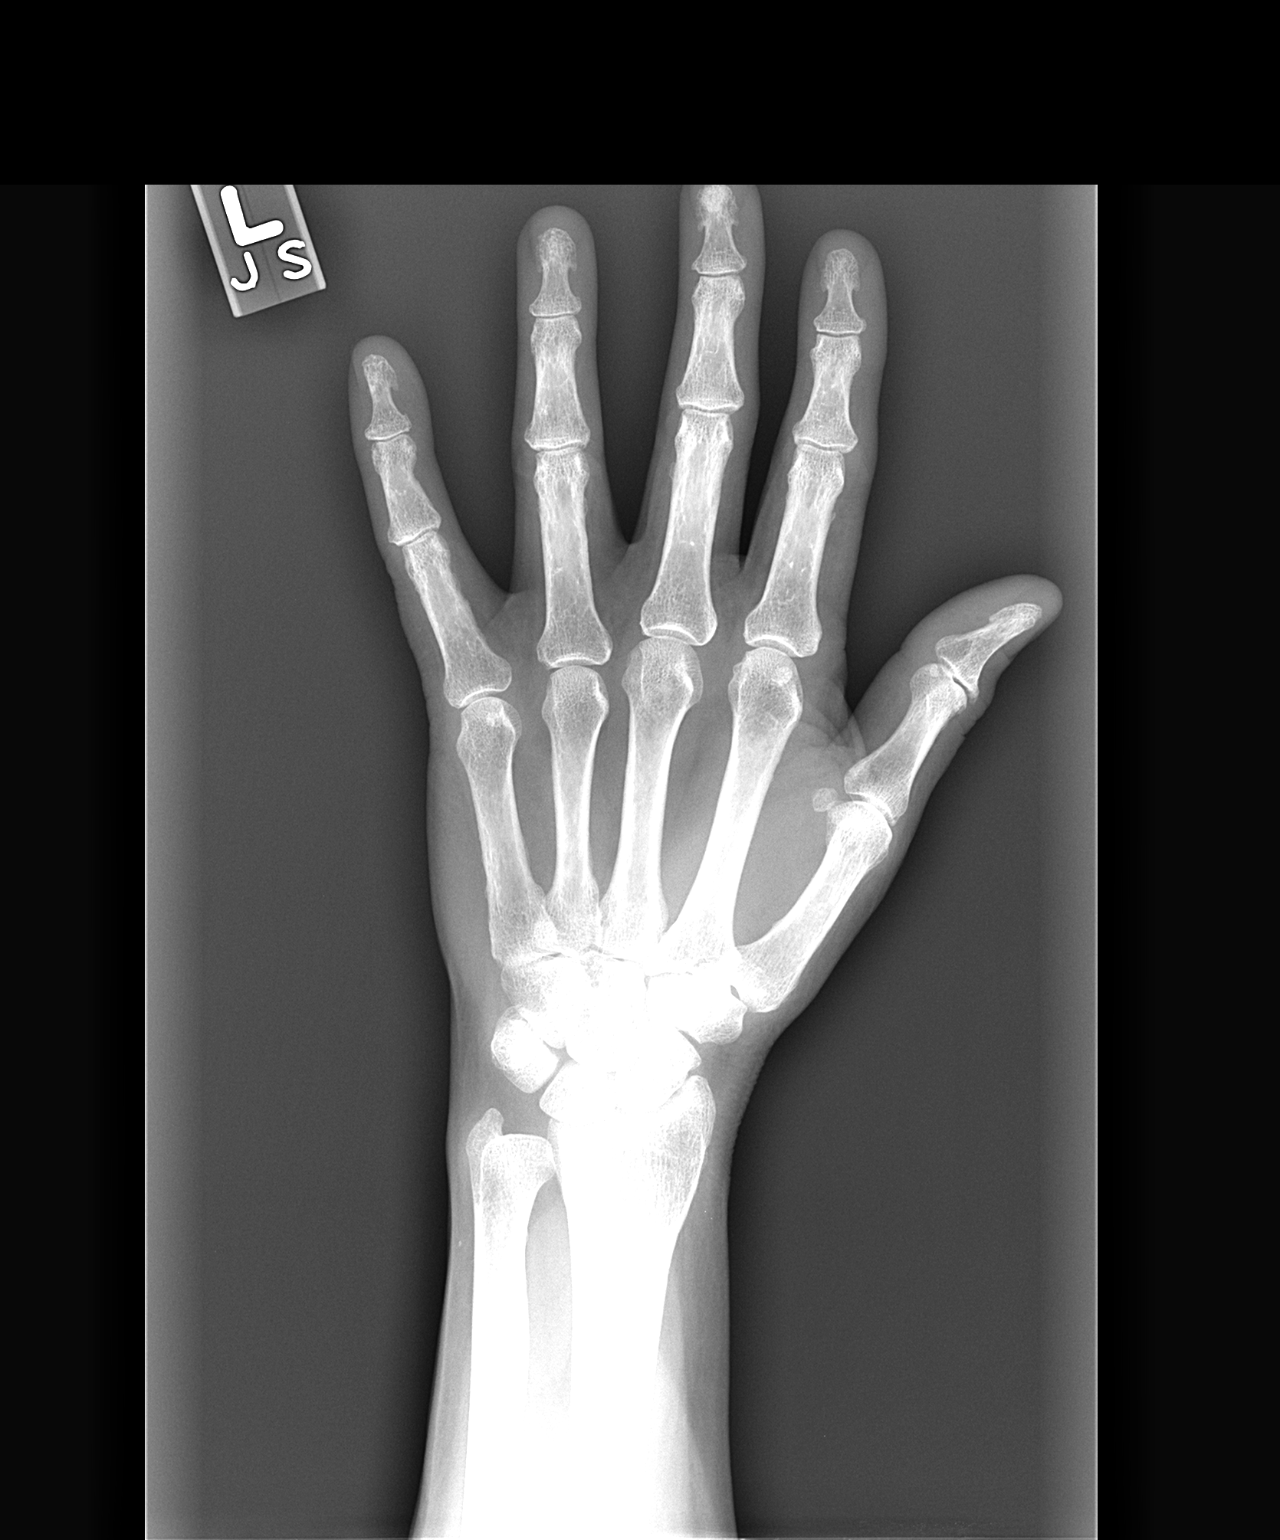

[view not recorded (2 of 2)]
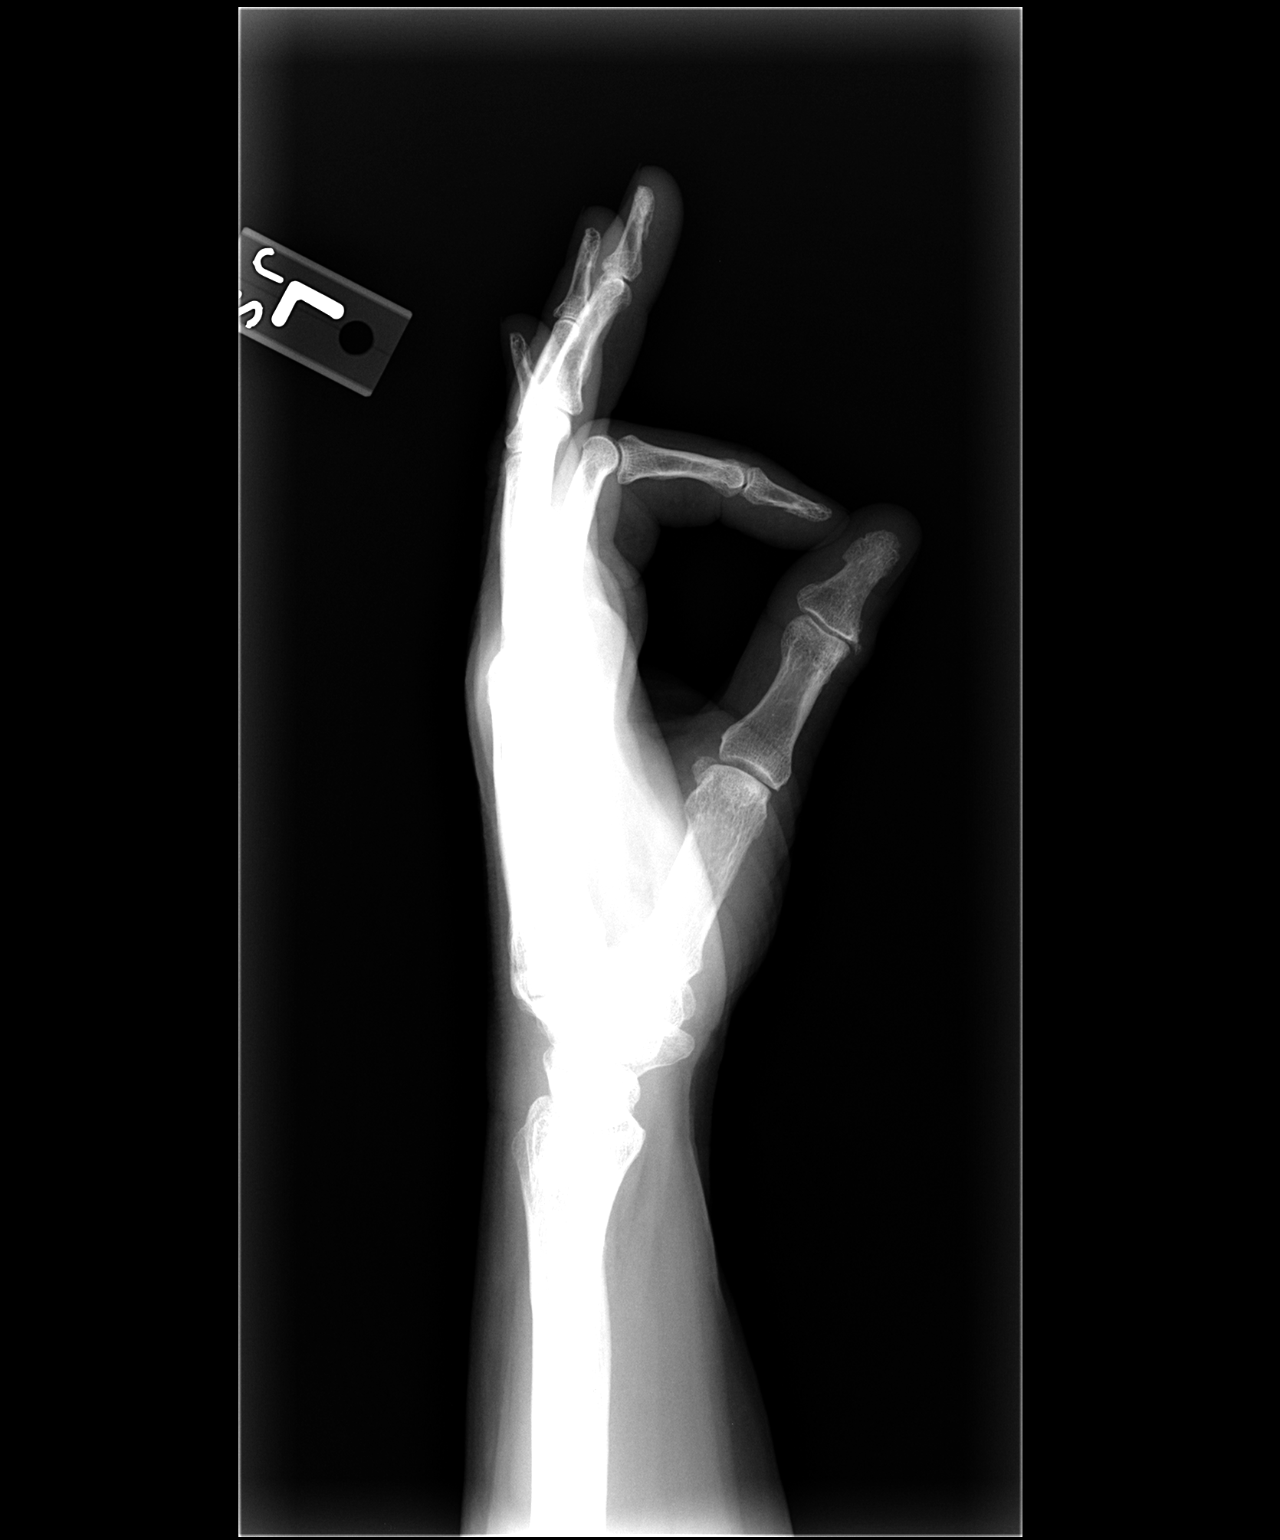

[2 of 2 positions shown; findings below may reference images not displayed]

FINDINGS: No evidence of fracture, dislocation, bony lesion or
underlying soft tissue abnormality.  No bony erosions.  No
significant arthropathy identified.
IMPRESSION: Normal left hand.

## 2012-05-04 ENCOUNTER — Ambulatory Visit: Payer: BC Managed Care – PPO | Admitting: Internal Medicine

## 2012-05-21 ENCOUNTER — Encounter: Payer: BC Managed Care – PPO | Admitting: Gastroenterology

## 2013-01-03 ENCOUNTER — Other Ambulatory Visit: Payer: Self-pay

## 2013-01-21 ENCOUNTER — Telehealth: Payer: Self-pay | Admitting: Internal Medicine

## 2013-01-21 ENCOUNTER — Encounter: Payer: Self-pay | Admitting: Gastroenterology

## 2013-01-21 NOTE — Telephone Encounter (Signed)
A user error has taken place.

## 2013-01-30 ENCOUNTER — Other Ambulatory Visit: Payer: Self-pay | Admitting: Internal Medicine

## 2013-01-30 DIAGNOSIS — Z01419 Encounter for gynecological examination (general) (routine) without abnormal findings: Secondary | ICD-10-CM

## 2013-01-30 DIAGNOSIS — Z1239 Encounter for other screening for malignant neoplasm of breast: Secondary | ICD-10-CM

## 2013-01-31 ENCOUNTER — Other Ambulatory Visit: Payer: Self-pay | Admitting: *Deleted

## 2013-01-31 DIAGNOSIS — Z1231 Encounter for screening mammogram for malignant neoplasm of breast: Secondary | ICD-10-CM

## 2013-02-11 ENCOUNTER — Ambulatory Visit (INDEPENDENT_AMBULATORY_CARE_PROVIDER_SITE_OTHER): Payer: Managed Care, Other (non HMO) | Admitting: Internal Medicine

## 2013-02-11 ENCOUNTER — Encounter: Payer: Self-pay | Admitting: Internal Medicine

## 2013-02-11 VITALS — BP 110/70 | HR 80 | Temp 97.1°F | Resp 16 | Ht 67.0 in | Wt 164.0 lb

## 2013-02-11 DIAGNOSIS — Z0000 Encounter for general adult medical examination without abnormal findings: Secondary | ICD-10-CM

## 2013-02-11 MED ORDER — FLUTICASONE PROPIONATE 50 MCG/ACT NA SUSP: 2.0000 | Freq: Every day | NASAL | Status: DC

## 2013-02-11 NOTE — Progress Notes (Signed)
   Subjective:     HPI  The patient is here for a wellness exam. The patient has been doing well overall without major physical or psychological issues going on lately.  C/o DOE, fatigue off and on x several months.  Wt Readings from Last 3 Encounters:  02/11/13 164 lb (74.39 kg)  03/12/12 164 lb (74.39 kg)  02/03/12 161 lb (73.029 kg)   BP Readings from Last 3 Encounters:  02/11/13 110/70  03/12/12 107/68  02/03/12 112/72      Review of Systems  Constitutional: Negative.  Negative for fever, chills, diaphoresis, activity change, appetite change, fatigue and unexpected weight change.  HENT: Negative for congestion, ear pain, facial swelling, hearing loss, mouth sores, nosebleeds, postnasal drip, rhinorrhea, sinus pressure, sneezing, sore throat, tinnitus and trouble swallowing.   Eyes: Negative for pain, discharge, redness, itching and visual disturbance.  Respiratory: Negative for cough, chest tightness, shortness of breath, wheezing and stridor.   Cardiovascular: Negative for chest pain, palpitations and leg swelling.  Gastrointestinal: Negative for nausea, abdominal pain, diarrhea, constipation, blood in stool, abdominal distention, anal bleeding and rectal pain.  Genitourinary: Negative for dysuria, urgency, frequency, hematuria, flank pain, vaginal bleeding, vaginal discharge, difficulty urinating, genital sores and pelvic pain.  Musculoskeletal: Negative for arthralgias, back pain, gait problem, joint swelling, neck pain and neck stiffness.  Skin: Negative.  Negative for rash.  Neurological: Negative for dizziness, tremors, seizures, syncope, speech difficulty, weakness, numbness and headaches.  Hematological: Negative for adenopathy. Does not bruise/bleed easily.  Psychiatric/Behavioral: Negative for suicidal ideas, behavioral problems, sleep disturbance, dysphoric mood and decreased concentration. The patient is not nervous/anxious.        Objective:   Physical Exam   Constitutional: She appears well-developed and well-nourished. No distress.  HENT:  Head: Normocephalic.  Right Ear: External ear normal.  Left Ear: External ear normal.  Nose: Nose normal.  Mouth/Throat: Oropharynx is clear and moist.  Eyes: Conjunctivae are normal. Pupils are equal, round, and reactive to light. Right eye exhibits no discharge. Left eye exhibits no discharge.  Neck: Normal range of motion. Neck supple. No JVD present. No tracheal deviation present. No thyromegaly present.  Cardiovascular: Normal rate and regular rhythm.   Murmur (1-2/6) heard. Pulmonary/Chest: No stridor. No respiratory distress. She has no wheezes.  Abdominal: Soft. Bowel sounds are normal. She exhibits no distension and no mass. There is no tenderness. There is no rebound and no guarding.  Musculoskeletal: She exhibits no edema and no tenderness.  Lymphadenopathy:    She has no cervical adenopathy.  Neurological: She displays normal reflexes. No cranial nerve deficit. She exhibits normal muscle tone. Coordination normal.  Skin: No rash noted. No erythema.  Psychiatric: She has a normal mood and affect. Her behavior is normal. Judgment and thought content normal.        Lab Results  Component Value Date   WBC 5.9 01/24/2012   HGB 13.8 01/24/2012   HCT 41.3 01/24/2012   PLT 216.0 01/24/2012   CHOL 172 01/24/2012   TRIG 93.0 01/24/2012   HDL 51.00 01/24/2012   LDLDIRECT 158.4 07/16/2010   ALT 20 01/24/2012   AST 16 01/24/2012   NA 139 01/24/2012   K 4.4 01/24/2012   CL 101 01/24/2012   CREATININE 0.7 01/24/2012   BUN 12 01/24/2012   CO2 33* 01/24/2012   TSH 2.35 01/24/2012    Assessment & Plan:

## 2013-02-11 NOTE — Progress Notes (Signed)
Pre visit review using our clinic review tool, if applicable. No additional management support is needed unless otherwise documented below in the visit note. 

## 2013-02-11 NOTE — Assessment & Plan Note (Signed)
We discussed age appropriate health related issues, including available/recomended screening tests and vaccinations. We discussed a need for adhering to healthy diet and exercise. Labs/EKG were reviewed/ordered. All questions were answered.   

## 2013-02-13 ENCOUNTER — Other Ambulatory Visit (INDEPENDENT_AMBULATORY_CARE_PROVIDER_SITE_OTHER): Payer: Managed Care, Other (non HMO)

## 2013-02-13 DIAGNOSIS — Z0000 Encounter for general adult medical examination without abnormal findings: Secondary | ICD-10-CM

## 2013-02-13 LAB — URINALYSIS, ROUTINE W REFLEX MICROSCOPIC
Bilirubin Urine: NEGATIVE
Hgb urine dipstick: NEGATIVE
Ketones, ur: NEGATIVE
Nitrite: NEGATIVE
Total Protein, Urine: NEGATIVE
Urine Glucose: NEGATIVE
pH: 6.5 (ref 5.0–8.0)

## 2013-02-13 LAB — BASIC METABOLIC PANEL
Calcium: 9.4 mg/dL (ref 8.4–10.5)
Chloride: 105 mEq/L (ref 96–112)
Glucose, Bld: 90 mg/dL (ref 70–99)
Potassium: 4.5 mEq/L (ref 3.5–5.1)

## 2013-02-13 LAB — CBC WITH DIFFERENTIAL/PLATELET
Basophils Relative: 0.4 % (ref 0.0–3.0)
Eosinophils Absolute: 0.1 10*3/uL (ref 0.0–0.7)
Eosinophils Relative: 2.1 % (ref 0.0–5.0)
HCT: 42 % (ref 36.0–46.0)
Hemoglobin: 14.2 g/dL (ref 12.0–15.0)
Lymphs Abs: 2.5 10*3/uL (ref 0.7–4.0)
MCHC: 33.9 g/dL (ref 30.0–36.0)
MCV: 86.3 fl (ref 78.0–100.0)
Monocytes Absolute: 0.7 10*3/uL (ref 0.1–1.0)
RBC: 4.87 Mil/uL (ref 3.87–5.11)
RDW: 13.6 % (ref 11.5–14.6)
WBC: 5.9 10*3/uL (ref 4.5–10.5)

## 2013-02-13 LAB — HEPATIC FUNCTION PANEL
ALT: 19 U/L (ref 0–35)
AST: 19 U/L (ref 0–37)
Albumin: 4.1 g/dL (ref 3.5–5.2)
Total Protein: 7.3 g/dL (ref 6.0–8.3)

## 2013-02-13 LAB — LIPID PANEL
Cholesterol: 179 mg/dL (ref 0–200)
LDL Cholesterol: 113 mg/dL — ABNORMAL HIGH (ref 0–99)

## 2013-02-19 ENCOUNTER — Ambulatory Visit (AMBULATORY_SURGERY_CENTER): Payer: Self-pay | Admitting: *Deleted

## 2013-02-19 VITALS — Ht 65.5 in | Wt 164.8 lb

## 2013-02-19 DIAGNOSIS — Z1211 Encounter for screening for malignant neoplasm of colon: Secondary | ICD-10-CM

## 2013-02-19 MED ORDER — MOVIPREP 100 G PO SOLR: ORAL | Status: DC

## 2013-02-19 NOTE — Progress Notes (Signed)
No allergies to eggs or soy. No problems with anesthesia.  Pt speaks [place]; a little [place].  Release of responsibility for interpretation form signed.  Pt chooses to have husband interpret.

## 2013-02-27 ENCOUNTER — Encounter: Payer: Self-pay | Admitting: Gastroenterology

## 2013-03-04 ENCOUNTER — Encounter: Payer: BC Managed Care – PPO | Admitting: Gastroenterology

## 2013-03-07 ENCOUNTER — Ambulatory Visit: Payer: Self-pay | Admitting: Gynecology

## 2013-03-11 ENCOUNTER — Ambulatory Visit: Payer: BC Managed Care – PPO

## 2013-03-14 ENCOUNTER — Other Ambulatory Visit (HOSPITAL_COMMUNITY)
Admission: RE | Admit: 2013-03-14 | Discharge: 2013-03-14 | Disposition: A | Discharge status: Home / Self Care | Payer: Managed Care, Other (non HMO) | Source: Ambulatory Visit | Attending: Gynecology | Admitting: Gynecology

## 2013-03-14 ENCOUNTER — Ambulatory Visit (INDEPENDENT_AMBULATORY_CARE_PROVIDER_SITE_OTHER): Payer: Managed Care, Other (non HMO) | Admitting: Gynecology

## 2013-03-14 ENCOUNTER — Encounter: Payer: Self-pay | Admitting: Gynecology

## 2013-03-14 VITALS — BP 116/74 | Ht 66.5 in | Wt 162.8 lb

## 2013-03-14 DIAGNOSIS — Z01419 Encounter for gynecological examination (general) (routine) without abnormal findings: Secondary | ICD-10-CM

## 2013-03-14 DIAGNOSIS — N951 Menopausal and female climacteric states: Secondary | ICD-10-CM

## 2013-03-14 DIAGNOSIS — IMO0002 Reserved for concepts with insufficient information to code with codable children: Secondary | ICD-10-CM | POA: Insufficient documentation

## 2013-03-14 DIAGNOSIS — Z1151 Encounter for screening for human papillomavirus (HPV): Secondary | ICD-10-CM | POA: Insufficient documentation

## 2013-03-14 DIAGNOSIS — Z780 Asymptomatic menopausal state: Secondary | ICD-10-CM

## 2013-03-14 DIAGNOSIS — N952 Postmenopausal atrophic vaginitis: Secondary | ICD-10-CM | POA: Insufficient documentation

## 2013-03-14 MED ORDER — OSPEMIFENE 60 MG PO TABS: 60.0000 mg | ORAL_TABLET | Freq: Every day | ORAL | Status: DC

## 2013-03-14 NOTE — Addendum Note (Signed)
Addended by: Alen Blew on: 03/14/2013 04:34 PM   Modules accepted: Orders

## 2013-03-14 NOTE — Progress Notes (Signed)
Jacqueline Lynn 1950/12/14 [id number]   History:    63 y.o.  for annual gyn exam who is a new patient to the practice. Patient's PCP is Dr. Alain Marion who has referred her to our practice for gynecological care. Patient's husband was present during the interview and examination. Patient states her last gynecological exam and Pap smear was 5 years ago. Patient had an abdominal hysterectomy in [place] for what appears to be fibroid uterus? I reviewed the patient's ultrasound that was done here by her PCP in 2011 and there was description of a cervix still present and absent uterus and right ovary. It appears patient had a supracervical hysterectomy with right salpingo-oophorectomy. Patient is Tdap vaccine is up-to-date. Patient declines the flu vaccine. Patient scheduled for mammogram next week. Patient scheduled for colonoscopy in the next few weeks. Patient with no prior bone density study. Patient denies any past history of abnormal Pap smears. Patient denies any prior history of hormone replacement therapy. Patient has been complaining of vaginal dryness, irritation, and dyspareunia.  Past medical history,surgical history, family history and social history were all reviewed and documented in the EPIC chart.  Gynecologic History No LMP recorded. Patient is postmenopausal. Contraception: post menopausal status Last Pap: Over 5 years ago. Results were: normal Last mammogram: 2009. Results were: normal  Obstetric History OB History  Gravida Para Term Preterm AB SAB TAB Ectopic Multiple Living  3 1   2 2    1     # Outcome Date GA Lbr Len/2nd Weight Sex Delivery Anes PTL Lv  3 SAB           2 SAB           1 PAR                ROS: A ROS was performed and pertinent positives and negatives are included in the history.  GENERAL: No fevers or chills. HEENT: No change in vision, no earache, sore throat or sinus congestion. NECK: No pain or stiffness. CARDIOVASCULAR: No chest pain or  pressure. No palpitations. PULMONARY: No shortness of breath, cough or wheeze. GASTROINTESTINAL: No abdominal pain, nausea, vomiting or diarrhea, melena or bright red blood per rectum. GENITOURINARY: No urinary frequency, urgency, hesitancy or dysuria. MUSCULOSKELETAL: No joint or muscle pain, no back pain, no recent trauma. DERMATOLOGIC: No rash, no itching, no lesions. ENDOCRINE: No polyuria, polydipsia, no heat or cold intolerance. No recent change in weight. HEMATOLOGICAL: No anemia or easy bruising or bleeding. NEUROLOGIC: No headache, seizures, numbness, tingling or weakness. PSYCHIATRIC: No depression, no loss of interest in normal activity or change in sleep pattern.     Exam: chaperone present  BP 116/74  Ht 5' 6.5" (1.689 m)  Wt 162 lb 12.8 oz (73.846 kg)  BMI 25.89 kg/m2  Body mass index is 25.89 kg/(m^2).  General appearance : Well developed well nourished female. No acute distress HEENT: Neck supple, trachea midline, no carotid bruits, no thyroidmegaly Lungs: Clear to auscultation, no rhonchi or wheezes, or rib retractions  Heart: Regular rate and rhythm, no murmurs or gallops Breast:Examined in sitting and supine position were symmetrical in appearance, no palpable masses or tenderness,  no skin retraction, no nipple inversion, no nipple discharge, no skin discoloration, no axillary or supraclavicular lymphadenopathy Abdomen: no palpable masses or tenderness, no rebound or guarding Extremities: no edema or skin discoloration or tenderness  Pelvic:  Bartholin, Urethra, Skene Glands: Within normal limits  Vagina: No gross lesions or discharge, vaginal atrophy  Cervix: No gross lesions or discharge  Uterus absent  Adnexa  Without masses or tenderness  Anus and perineum  normal   Rectovaginal  normal sphincter tone without palpated masses or tenderness             Hemoccult PCP provides     Assessment/Plan:  63 y.o. female for annual exam who is postmenopausal  suffering from vaginal atrophy, irritation and dyspareunia. Patient with no vasomotor symptoms. Patient will be started on Osphena 60 mg daily for vaginal atrophy. Risks benefits and pros and cons discussed. Ligatured formation was provided as well as samples. Her Pap smear was done today with HPV since it has been over 5 years since her last Pap smear. She was reminded on the importance of calcium and vitamin D for osteoporosis prevention along with regular exercise. She will schedule a bone density study here in our office in the next 2 weeks. She will followup with her previously scheduled colonoscopy and mammography as well. Blood work was done by her PCP.  Note: This dictation was prepared with  Dragon/digital dictation along withSmart phrase technology. Any transcriptional errors that result from this process are unintentional.   Terrance Mass MD, 10:51 AM 03/14/2013

## 2013-03-14 NOTE — Patient Instructions (Addendum)
Bone Densitometry Bone densitometry is a special X-ray that measures your bone density and can be used to help predict your risk of bone fractures. This test is used to determine bone mineral content and density to diagnose osteoporosis. Osteoporosis is the loss of bone that may cause the bone to become weak. Osteoporosis commonly occurs in women entering menopause. However, it may be found in men and in people with other diseases. PREPARATION FOR TEST No preparation necessary. WHO SHOULD BE TESTED?  All women older than 65.  Postmenopausal women (50 to 28) with risk factors for osteoporosis.  People with a previous fracture caused by normal activities.  People with a small body frame (less than 127 poundsor a body mass index [BMI] of less than 21).  People who have a parent with a hip fracture or history of osteoporosis.  People who smoke.  People who have rheumatoid arthritis.  Anyone who engages in excessive alcohol use (more than 3 drinks most days).  Women who experience early menopause. WHEN SHOULD YOU BE RETESTED? Current guidelines suggest that you should wait at least 2 years before doing a bone density test again if your first test was normal.Recent studies indicated that women with normal bone density may be able to wait a few years before needing to repeat a bone density test. You should discuss this with your caregiver.  NORMAL FINDINGS   Normal: less than standard deviation below normal (greater than -1).  Osteopenia: 1 to 2.5 standard deviations below normal (-1 to -2.5).  Osteoporosis: greater than 2.5 standard deviations below normal (less than -2.5). Test results are reported as a "T score" and a "Z score."The T score is a number that compares your bone density with the bone density of healthy, young women.The Z score is a number that compares your bone density with the scores of women who are the same age, gender, and race.  Ranges for normal findings may vary  among different laboratories and hospitals. You should always check with your doctor after having lab work or other tests done to discuss the meaning of your test results and whether your values are considered within normal limits. MEANING OF TEST  Your caregiver will go over the test results with you and discuss the importance and meaning of your results, as well as treatment options and the need for additional tests if necessary. OBTAINING THE TEST RESULTS It is your responsibility to obtain your test results. Ask the lab or department performing the test when and how you will get your results. Document Released: 03/08/2004 Document Revised: 05/09/2011 Document Reviewed: 03/31/2010 [facility] Patient Information 2014 [place]. Ospemifene oral tablets What is this medicine? OSPEMIFENE (os PEM i feen) is used to treat painful sexual intercourse in females, a symptom of changes in and around the vagina during menopause. This medicine may be used for other purposes; ask your health care provider or pharmacist if you have questions. COMMON BRAND NAME(S): Osphena What should I tell my health care provider before I take this medicine? They need to know if you have any of these conditions: -cancer, such as breast, uterine, or other cancer -heart disease -history of blood clots -history of stroke -history of vaginal bleeding -liver disease -premenopausal -smoke tobacco -an unusual or allergic reaction to ospemifene, other medicines, foods, dyes, or preservatives -pregnant or trying to get pregnant -breast-feeding How should I use this medicine? Take this medicine by mouth with a glass of water. Take this medicine with food. Follow the directions  on the prescription label. Do not take your medicine more often than directed. Talk to your pediatrician regarding the use of this medicine in children. Special care may be needed. Overdosage: If you think you've taken too much of this medicine  contact a poison control center or emergency room at once. Overdosage: If you think you have taken too much of this medicine contact a poison control center or emergency room at once. NOTE: This medicine is only for you. Do not share this medicine with others. What if I miss a dose? If you miss a dose, take it as soon as you can. If it is almost time for your next dose, take only that dose. Do not take double or extra doses. What may interact with this medicine? -doxycycline -estrogens -fluconazole -furosemide -glyburide -ketoconazole -phenytoin -rifampin -warfarin This list may not describe all possible interactions. Give your health care provider a list of all the medicines, herbs, non-prescription drugs, or dietary supplements you use. Also tell them if you smoke, drink alcohol, or use illegal drugs. Some items may interact with your medicine. What should I watch for while using this medicine? Visit your health care professional for regular checks on your progress. You will need a regular breast and pelvic exam and Pap smear while on this medicine. You should also discuss the need for regular mammograms with your health care professional, and follow his or her guidelines for these tests. Also, periodically discuss the need to continue taking this medicine. Taking this medicine for long periods of time may increase your risk for serious side effects. This medicine can increase the risk of developing a condition (endometrial hyperplasia) that may lead to cancer of the lining of the uterus. Taking progestins, another hormone drug, with this medicine lowers the risk of developing this condition. Therefore, if your uterus has not been removed (by a hysterectomy), your doctor may prescribe a progestin for you to take together with your estrogen. You should know, however, that taking estrogens with progestins may have additional health risks. You should discuss the use of estrogens and progestins with  your health care professional to determine the benefits and risks for you. This medicine can rarely cause blood clots. You should avoid long periods of bed rest while taking this medicine. If you are going to have surgery, tell your doctor or health care professional that you are taking this medicine. This medicine should be stopped at least 4-6 weeks before surgery. After surgery, it should be restarted only after you are walking again. It should not be restarted while you still need long periods of bed rest. You should not smoke while taking this medicine. Smoking may also increase your risk of blood clots. Smoking can also decrease the effects of this medicine. This medicine does not prevent hot flashes. It may cause hot flashes in some patients. If you have any reason to think you are pregnant; stop taking this medicine at once and contact your doctor or health care professional. What side effects may I notice from receiving this medicine? Side effects that you should report to your doctor or health care professional as soon as possible: -breathing problems -changes in vision -confusion, trouble speaking or understanding -new breast lumps -pain, swelling, warmth in the leg -pelvic pain or pressure -severe headaches -sudden chest pain -sudden numbness or weakness of the face, arm or leg -trouble walking, dizziness, loss of balance or coordination -unusual vaginal bleeding patterns -vaginal discharge that is bloody or brown  Side effects  that usually do not require medical attention (Report these to your doctor or health care professional if they continue or are bothersome.): -hot flushes or flashes -increased sweating -muscle cramps -vaginal discharge (white or clear) This list may not describe all possible side effects. Call your doctor for medical advice about side effects. You may report side effects to FDA at 1-800-FDA-1088. Where should I keep my medicine? Keep out of the reach of  children. Store at room temperature between 20 and 25 degrees C (68 and 77 degrees F). Protect from light. Keep container tightly closed. Throw away any unused medicine after the expiration date. NOTE: This sheet is a summary. It may not cover all possible information. If you have questions about this medicine, talk to your doctor, pharmacist, or health care provider.  2014, Elsevier/Gold Standard. (2011-04-28 10:29:05)

## 2013-03-20 ENCOUNTER — Encounter: Payer: Self-pay | Admitting: Gastroenterology

## 2013-03-20 ENCOUNTER — Ambulatory Visit (AMBULATORY_SURGERY_CENTER): Payer: Managed Care, Other (non HMO) | Admitting: Gastroenterology

## 2013-03-20 VITALS — BP 112/67 | HR 63 | Temp 97.7°F | Resp 16 | Ht 65.5 in | Wt 164.0 lb

## 2013-03-20 DIAGNOSIS — Z1211 Encounter for screening for malignant neoplasm of colon: Secondary | ICD-10-CM

## 2013-03-20 MED ORDER — SODIUM CHLORIDE 0.9 % IV SOLN: 500.0000 mL | INTRAVENOUS | Status: DC

## 2013-03-20 NOTE — Patient Instructions (Signed)

## 2013-03-20 NOTE — Op Note (Signed)
[place]  [address] [place], [id number]   COLONOSCOPY PROCEDURE REPORT  PATIENT: Jacqueline, Lynn  MR#: [id number] BIRTHDATE: [birth year] , 41  yrs. old GENDER: Female ENDOSCOPIST: Sable Feil, MD, [facility] REFERRED IW:PYKD Avel Sensor, M.D. PROCEDURE DATE:  03/20/2013 PROCEDURE:   Colonoscopy, screening Prior Negative Screening - Now for repeat screening.  N/A History of Adenoma - Now for follow-up colonoscopy & has been > or = to 3 yrs.  N/A ASA CLASS:   Class I INDICATIONS: MEDICATIONS: propofol (Diprivan) 200mg  IV  DESCRIPTION OF PROCEDURE:   After the risks benefits and alternatives of the procedure were thoroughly explained, informed consent was obtained.  A digital rectal exam revealed no abnormalities of the rectum.   The [id number]  endoscope was introduced through the anus and advanced to the cecum, which was identified by both the appendix and ileocecal valve. No adverse events experienced.   The quality of the prep was excellent, using MoviPrep  The instrument was then slowly withdrawn as the colon was fully examined.      COLON FINDINGS: A normal appearing cecum, ileocecal valve, and appendiceal orifice were identified.  The ascending, hepatic flexure, transverse, splenic flexure, descending, sigmoid colon and rectum appeared unremarkable.  No polyps or cancers were seen. Retroflexed views revealed no abnormalities. The time to cecum=4 minutes 28 seconds.  Withdrawal time=6 minutes 10 seconds.  The scope was withdrawn and the procedure completed. COMPLICATIONS: There were no complications.  ENDOSCOPIC IMPRESSION: Normal colon...no polyps noted  RECOMMENDATIONS: 1.  Continue current colorectal screening recommendations for "routine risk" patients with a repeat colonoscopy in 10 years. 2.  Continue current medications   eSigned:  Sable Feil, MD, [facility] 03/20/2013 8:42 AM   cc:

## 2013-03-20 NOTE — Progress Notes (Signed)
A/ox3 pleased with MAC, report to Jane RN 

## 2013-03-21 ENCOUNTER — Telehealth: Payer: Self-pay | Admitting: *Deleted

## 2013-03-21 NOTE — Telephone Encounter (Signed)
  Follow up Call-  Call back number 03/20/2013  Post procedure Call Back phone  # [phone]  Permission to leave phone message Yes     Patient questions:  Do you have a fever, pain , or abdominal swelling? no Pain Score  0 *  Have you tolerated food without any problems? yes  Have you been able to return to your normal activities? yes  Do you have any questions about your discharge instructions: Diet   no Medications  no Follow up visit  no  Do you have questions or concerns about your Care? no  Actions: * If pain score is 4 or above: No action needed, pain <4.

## 2013-03-29 ENCOUNTER — Ambulatory Visit
Admission: RE | Admit: 2013-03-29 | Discharge: 2013-03-29 | Disposition: A | Discharge status: Home / Self Care | Payer: Managed Care, Other (non HMO) | Source: Ambulatory Visit | Attending: Internal Medicine | Admitting: Internal Medicine

## 2013-03-29 DIAGNOSIS — Z1231 Encounter for screening mammogram for malignant neoplasm of breast: Secondary | ICD-10-CM

## 2013-12-30 ENCOUNTER — Encounter: Payer: Self-pay | Admitting: Gastroenterology

## 2014-11-21 ENCOUNTER — Ambulatory Visit (INDEPENDENT_AMBULATORY_CARE_PROVIDER_SITE_OTHER): Payer: Managed Care, Other (non HMO) | Admitting: Internal Medicine

## 2014-11-21 ENCOUNTER — Ambulatory Visit (INDEPENDENT_AMBULATORY_CARE_PROVIDER_SITE_OTHER)
Admission: RE | Admit: 2014-11-21 | Discharge: 2014-11-21 | Disposition: A | Discharge status: Home / Self Care | Payer: Managed Care, Other (non HMO) | Source: Ambulatory Visit | Attending: Internal Medicine | Admitting: Internal Medicine

## 2014-11-21 ENCOUNTER — Encounter: Payer: Managed Care, Other (non HMO) | Admitting: Internal Medicine

## 2014-11-21 ENCOUNTER — Encounter: Payer: Self-pay | Admitting: Internal Medicine

## 2014-11-21 VITALS — BP 116/70 | HR 81 | Ht 67.0 in | Wt 166.0 lb

## 2014-11-21 DIAGNOSIS — Z0000 Encounter for general adult medical examination without abnormal findings: Secondary | ICD-10-CM | POA: Diagnosis not present

## 2014-11-21 DIAGNOSIS — R059 Cough, unspecified: Secondary | ICD-10-CM | POA: Insufficient documentation

## 2014-11-21 DIAGNOSIS — G4700 Insomnia, unspecified: Secondary | ICD-10-CM | POA: Diagnosis not present

## 2014-11-21 DIAGNOSIS — Z1239 Encounter for other screening for malignant neoplasm of breast: Secondary | ICD-10-CM | POA: Diagnosis not present

## 2014-11-21 DIAGNOSIS — R05 Cough: Secondary | ICD-10-CM

## 2014-11-21 MED ORDER — LORATADINE 10 MG PO TABS: 10.0000 mg | ORAL_TABLET | Freq: Every day | ORAL | Status: AC | PRN

## 2014-11-21 MED ORDER — DIPHENHYDRAMINE HCL 25 MG PO TABS: 25.0000 mg | ORAL_TABLET | Freq: Every evening | ORAL | Status: AC | PRN

## 2014-11-21 NOTE — Assessment & Plan Note (Signed)
We discussed age appropriate health related issues, including available/recomended screening tests and vaccinations. We discussed a need for adhering to healthy diet and exercise. Labs/EKG were reviewed/ordered. All questions were answered.   

## 2014-11-21 NOTE — Progress Notes (Signed)
Subjective:  Patient ID: Jacqueline Lynn, female    DOB: 12/17/1950  Age: 64 y.o. MRN: [id number]  CC: Annual Exam   HPI [name] presents for a well exam. C/o insomnia: Donormyl helps. C/o cough in the fall.  Outpatient Prescriptions Prior to Visit  Medication Sig Dispense Refill  . Cholecalciferol (EQL VITAMIN D3) 1000 UNITS tablet Take 1,000 Units by mouth daily.      . Fluticasone-Salmeterol (ADVAIR DISKUS) 100-50 MCG/DOSE AEPB Inhale 1 puff into the lungs 2 (two) times daily. 1 each 5  . ibuprofen (ADVIL,MOTRIN) 600 MG tablet Take 1 tablet (600 mg total) by mouth as needed. 60 tablet 2  . Ospemifene (OSPHENA) 60 MG TABS Take 60 mg by mouth daily. 30 tablet 11   No facility-administered medications prior to visit.    ROS Review of Systems  Constitutional: Negative for chills, activity change, appetite change, fatigue and unexpected weight change.  HENT: Negative for congestion, mouth sores and sinus pressure.   Eyes: Negative for visual disturbance.  Respiratory: Negative for cough and chest tightness.   Gastrointestinal: Negative for nausea and abdominal pain.  Genitourinary: Negative for frequency, difficulty urinating and vaginal pain.  Musculoskeletal: Negative for back pain and gait problem.  Skin: Negative for pallor and rash.  Neurological: Negative for dizziness, tremors, weakness, numbness and headaches.  Psychiatric/Behavioral: Positive for sleep disturbance. Negative for confusion.    Objective:  BP 116/70 mmHg  Pulse 81  Ht 5\' 7"  (1.702 m)  Wt 166 lb (75.297 kg)  BMI 25.99 kg/m2  SpO2 94%  BP Readings from Last 3 Encounters:  11/21/14 116/70  03/20/13 112/67  03/14/13 116/74    Wt Readings from Last 3 Encounters:  11/21/14 166 lb (75.297 kg)  03/20/13 164 lb (74.39 kg)  03/14/13 162 lb 12.8 oz (73.846 kg)    Physical Exam  Constitutional: She appears well-developed. No distress.  HENT:  Head: Normocephalic.  Right Ear: External  ear normal.  Left Ear: External ear normal.  Nose: Nose normal.  Mouth/Throat: Oropharynx is clear and moist.  Eyes: Conjunctivae are normal. Pupils are equal, round, and reactive to light. Right eye exhibits no discharge. Left eye exhibits no discharge.  Neck: Normal range of motion. Neck supple. No JVD present. No tracheal deviation present. No thyromegaly present.  Cardiovascular: Normal rate, regular rhythm and normal heart sounds.   Pulmonary/Chest: No stridor. No respiratory distress. She has no wheezes.  Abdominal: Soft. Bowel sounds are normal. She exhibits no distension and no mass. There is no tenderness. There is no rebound and no guarding.  Musculoskeletal: She exhibits no edema or tenderness.  Lymphadenopathy:    She has no cervical adenopathy.  Neurological: She displays normal reflexes. No cranial nerve deficit. She exhibits normal muscle tone. Coordination normal.  Skin: No rash noted. No erythema.  Psychiatric: She has a normal mood and affect. Her behavior is normal. Judgment and thought content normal.   EKG NSR  Lab Results  Component Value Date   WBC 5.9 02/13/2013   HGB 14.2 02/13/2013   HCT 42.0 02/13/2013   PLT 226.0 02/13/2013   GLUCOSE 90 02/13/2013   CHOL 179 02/13/2013   TRIG 42.0 02/13/2013   HDL 57.40 02/13/2013   LDLDIRECT 158.4 07/16/2010   LDLCALC 113* 02/13/2013   ALT 19 02/13/2013   AST 19 02/13/2013   NA 140 02/13/2013   K 4.5 02/13/2013   CL 105 02/13/2013   CREATININE 0.8 02/13/2013   BUN 17 02/13/2013  CO2 30 02/13/2013   TSH 3.00 02/13/2013    Mm Digital Screening  03/29/2013   CLINICAL DATA:  Screening.  EXAM: DIGITAL SCREENING BILATERAL MAMMOGRAM WITH CAD  COMPARISON:  Previous exam(s).  ACR Breast Density Category b: There are scattered areas of fibroglandular density.  FINDINGS: There are no findings suspicious for malignancy. Images were processed with CAD.  IMPRESSION: No mammographic evidence of malignancy. A result letter of  this screening mammogram will be mailed directly to the patient.  RECOMMENDATION: Screening mammogram in one year. (Code:SM-B-01Y)  BI-RADS CATEGORY  1: Negative.   Electronically Signed   By: Abelardo Diesel M.D.   On: 03/29/2013 14:25    Assessment & Plan:   [name] was seen today for annual exam.  Diagnoses and all orders for this visit:  Well adult exam -     EKG 12-Lead -     Basic metabolic panel; Future -     CBC with Differential/Platelet; Future -     Hepatic function panel; Future -     Lipid panel; Future -     TSH; Future -     Urinalysis; Future  INSOMNIA, PERSISTENT  Cough -     DG Chest 2 View  Breast cancer screening -     MM Digital Screening; Future  Other orders -     loratadine (CLARITIN) 10 MG tablet; Take 1 tablet (10 mg total) by mouth daily as needed for allergies. -     diphenhydrAMINE (BENADRYL) 25 MG tablet; Take 1-2 tablets (25-50 mg total) by mouth at bedtime as needed for sleep.   I have discontinued Ms. Grand's ibuprofen, Fluticasone-Salmeterol, and Ospemifene. I am also having her start on loratadine and diphenhydrAMINE. Additionally, I am having her maintain her Cholecalciferol.  Meds ordered this encounter  Medications  . loratadine (CLARITIN) 10 MG tablet    Sig: Take 1 tablet (10 mg total) by mouth daily as needed for allergies.    Dispense:  100 tablet    Refill:  3  . diphenhydrAMINE (BENADRYL) 25 MG tablet    Sig: Take 1-2 tablets (25-50 mg total) by mouth at bedtime as needed for sleep.    Dispense:  30 tablet    Refill:  11     Follow-up: Return in about 1 year (around 11/21/2015) for a follow-up visit.  Walker Kehr, MD

## 2014-11-21 NOTE — Assessment & Plan Note (Signed)
Try Benadryl at bedtime 25-50 mg

## 2014-11-21 NOTE — Assessment & Plan Note (Signed)
Claritin CXR

## 2014-11-21 NOTE — Patient Instructions (Signed)
Try Benadryl at bedtime 25-50 mg

## 2014-11-21 NOTE — Progress Notes (Signed)
Pre visit review using our clinic review tool, if applicable. No additional management support is needed unless otherwise documented below in the visit note. 

## 2014-12-02 ENCOUNTER — Other Ambulatory Visit (INDEPENDENT_AMBULATORY_CARE_PROVIDER_SITE_OTHER): Payer: Managed Care, Other (non HMO)

## 2014-12-02 DIAGNOSIS — Z0000 Encounter for general adult medical examination without abnormal findings: Secondary | ICD-10-CM

## 2014-12-02 LAB — BASIC METABOLIC PANEL
BUN: 16 mg/dL (ref 6–23)
CHLORIDE: 102 meq/L (ref 96–112)
CO2: 29 meq/L (ref 19–32)
CREATININE: 0.9 mg/dL (ref 0.40–1.20)
Calcium: 10.1 mg/dL (ref 8.4–10.5)
GFR: 67.01 mL/min (ref >60.00)
GLUCOSE: 100 mg/dL — AB (ref 70–99)
Potassium: 4.9 mEq/L (ref 3.5–5.1)
Sodium: 140 mEq/L (ref 135–145)

## 2014-12-02 LAB — CBC WITH DIFFERENTIAL/PLATELET
BASOS PCT: 0.7 % (ref 0.0–3.0)
Basophils Absolute: 0 10*3/uL (ref 0.0–0.1)
EOS ABS: 0.1 10*3/uL (ref 0.0–0.7)
Eosinophils Relative: 1.2 % (ref 0.0–5.0)
HEMATOCRIT: 42.7 % (ref 36.0–46.0)
Hemoglobin: 14.6 g/dL (ref 12.0–15.0)
LYMPHS ABS: 2.3 10*3/uL (ref 0.7–4.0)
Lymphocytes Relative: 35.7 % (ref 12.0–46.0)
MCHC: 34.1 g/dL (ref 30.0–36.0)
MCV: 86.3 fl (ref 78.0–100.0)
MONO ABS: 0.6 10*3/uL (ref 0.1–1.0)
Monocytes Relative: 9.5 % (ref 3.0–12.0)
NEUTROS ABS: 3.4 10*3/uL (ref 1.4–7.7)
Neutrophils Relative %: 52.9 % (ref 43.0–77.0)
PLATELETS: 245 10*3/uL (ref 150.0–400.0)
RBC: 4.95 Mil/uL (ref 3.87–5.11)
RDW: 13.4 % (ref 11.5–15.5)
WBC: 6.4 10*3/uL (ref 4.0–10.5)

## 2014-12-02 LAB — LIPID PANEL
CHOL/HDL RATIO: 3
Cholesterol: 206 mg/dL — ABNORMAL HIGH (ref 0–200)
HDL: 59.4 mg/dL (ref >39.00)
LDL Cholesterol: 131 mg/dL — ABNORMAL HIGH (ref 0–99)
NonHDL: 146.87
TRIGLYCERIDES: 80 mg/dL (ref 0.0–149.0)
VLDL: 16 mg/dL (ref 0.0–40.0)

## 2014-12-02 LAB — HEPATIC FUNCTION PANEL
ALT: 16 U/L (ref 0–35)
AST: 18 U/L (ref 0–37)
Albumin: 4.2 g/dL (ref 3.5–5.2)
Alkaline Phosphatase: 77 U/L (ref 39–117)
BILIRUBIN DIRECT: 0.1 mg/dL (ref 0.0–0.3)
TOTAL PROTEIN: 7.8 g/dL (ref 6.0–8.3)
Total Bilirubin: 0.6 mg/dL (ref 0.2–1.2)

## 2014-12-02 LAB — URINALYSIS, ROUTINE W REFLEX MICROSCOPIC
BILIRUBIN URINE: NEGATIVE
HGB URINE DIPSTICK: NEGATIVE
KETONES UR: NEGATIVE
NITRITE: NEGATIVE
PH: 7.5 (ref 5.0–8.0)
RBC / HPF: NONE SEEN (ref 0–?)
Specific Gravity, Urine: 1.01 (ref 1.000–1.030)
Total Protein, Urine: NEGATIVE
UROBILINOGEN UA: 0.2 (ref 0.0–1.0)
Urine Glucose: NEGATIVE

## 2014-12-02 LAB — TSH: TSH: 2.13 u[IU]/mL (ref 0.35–4.50)

## 2015-01-08 ENCOUNTER — Ambulatory Visit
Admission: RE | Admit: 2015-01-08 | Discharge: 2015-01-08 | Disposition: A | Discharge status: Home / Self Care | Payer: Managed Care, Other (non HMO) | Source: Ambulatory Visit | Attending: Internal Medicine | Admitting: Internal Medicine

## 2015-01-08 DIAGNOSIS — Z1239 Encounter for other screening for malignant neoplasm of breast: Secondary | ICD-10-CM

## 2015-05-28 ENCOUNTER — Encounter (INDEPENDENT_AMBULATORY_CARE_PROVIDER_SITE_OTHER): Payer: Self-pay

## 2016-08-10 IMAGING — CR DG CHEST 2V
2 series · 2 of 2 positions shown · non-contrast
Comparison: 02/03/2012 .

CLINICAL DATA: Cough.

EXAM:
CHEST  2 VIEW

[view not recorded (1 of 2)]
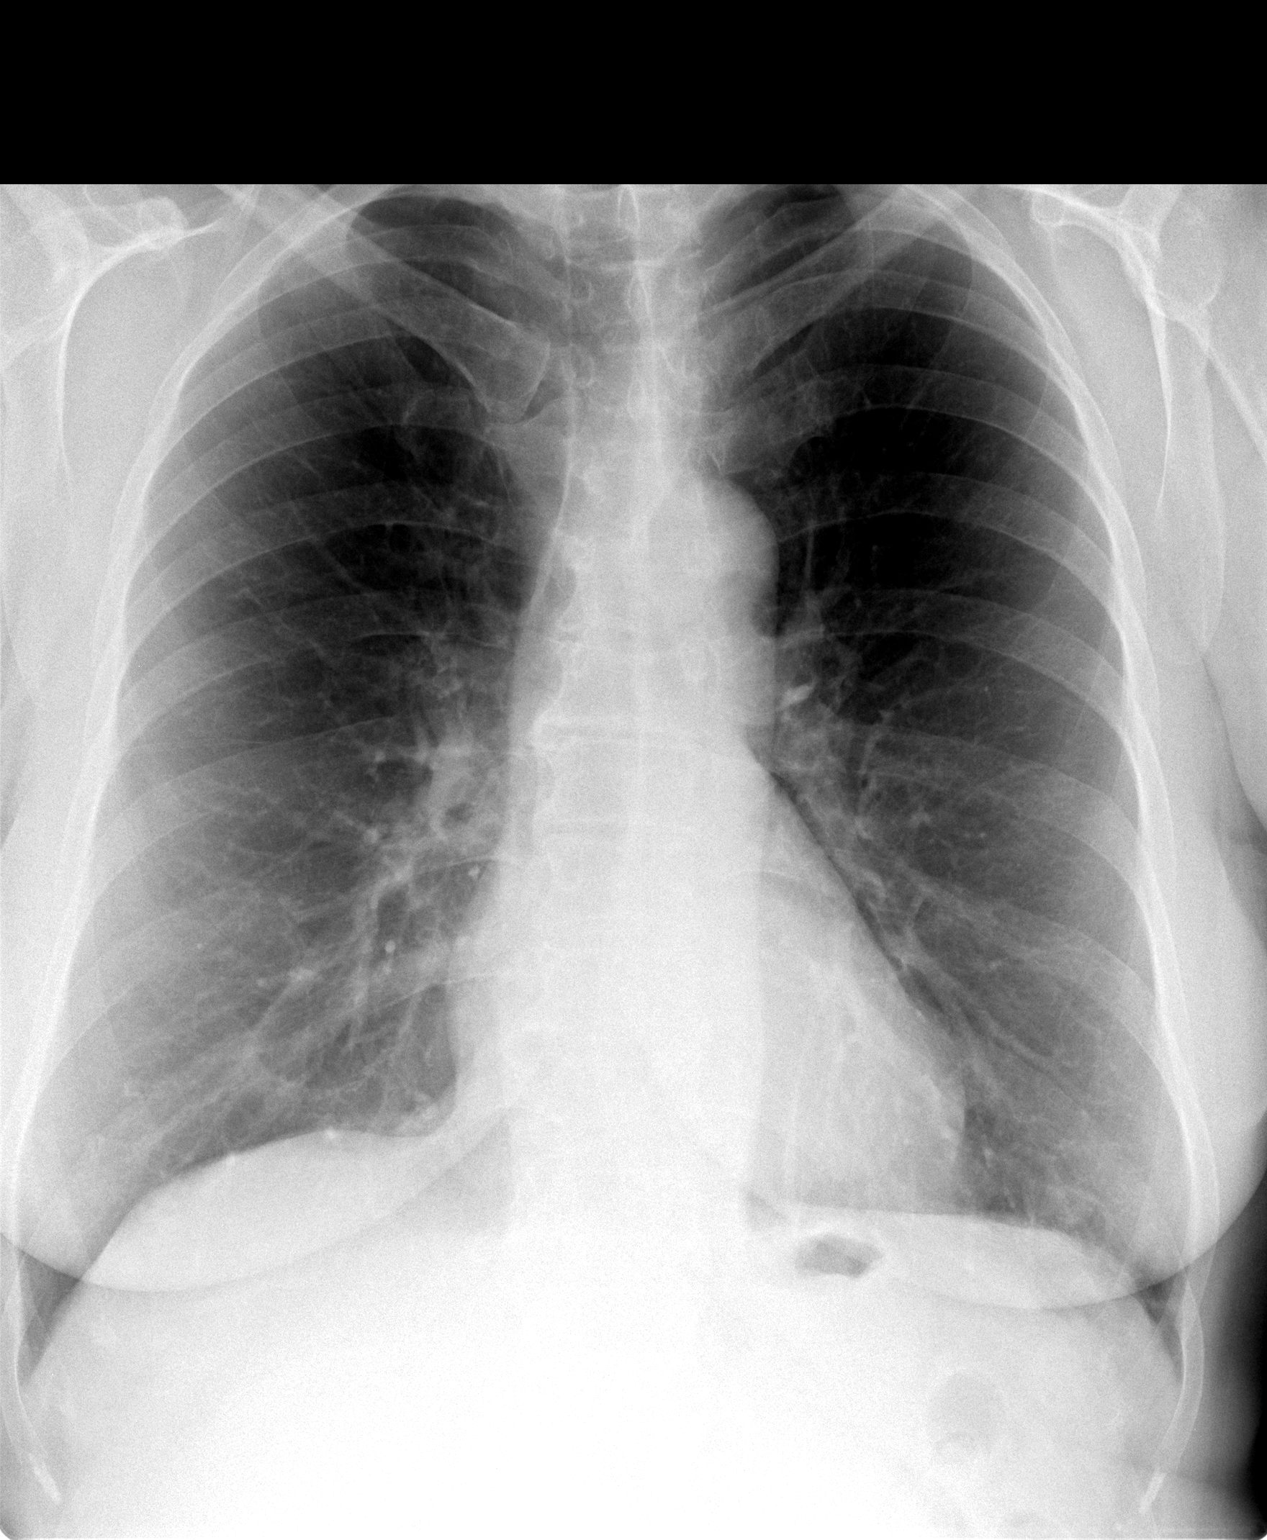

[view not recorded (2 of 2)]
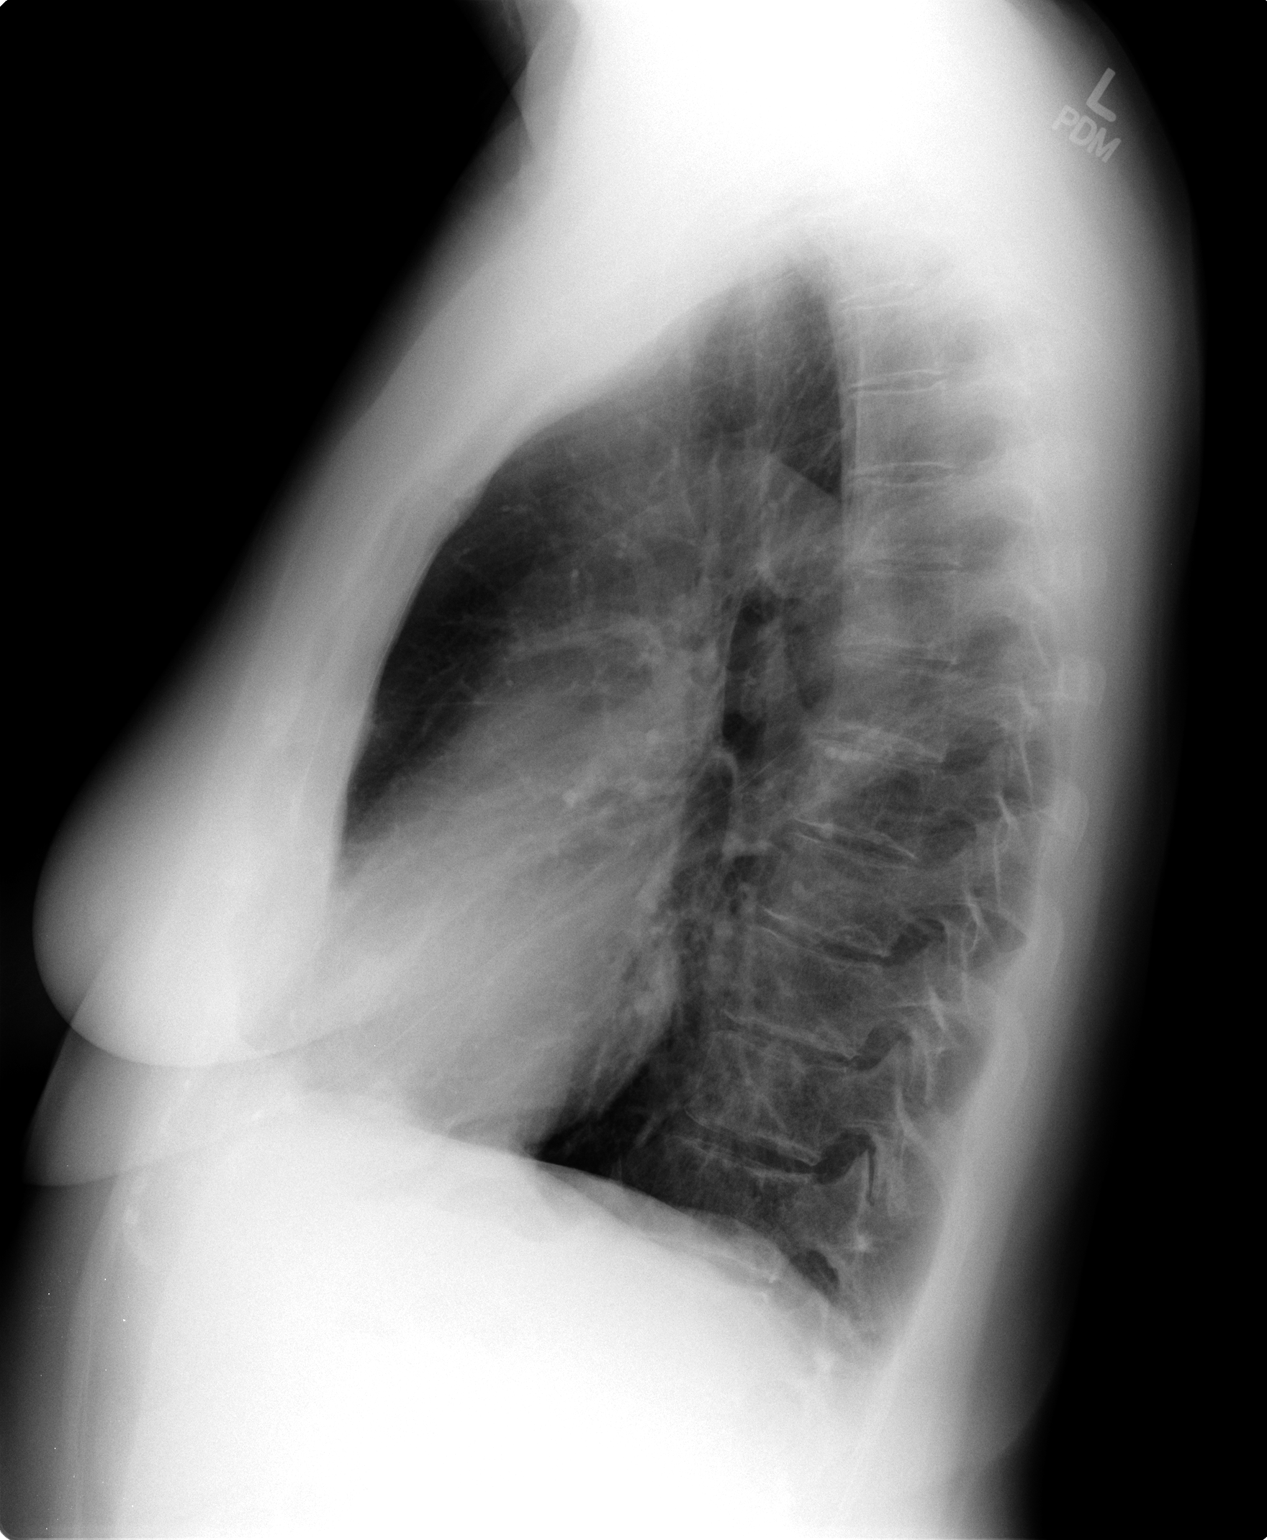

[2 of 2 positions shown; findings below may reference images not displayed]

FINDINGS: Mediastinum hilar structures normal. The lungs are clear of acute
infiltrates. Stable prominent overlapping vascular structures left
base. No pleural effusion or pneumothorax. Heart size stable.
Degenerative changes thoracic spine.
IMPRESSION: No acute cardiopulmonary disease.
